# Patient Record
Sex: Male | Born: 1975 | Race: Black or African American | Hispanic: No | Marital: Single | Smoking: Never smoker
Health system: Southern US, Community
[De-identification: ages and names within clinical notes are randomized; demographics above are authoritative.]

## PROBLEM LIST (undated history)

## (undated) DIAGNOSIS — K219 Gastro-esophageal reflux disease without esophagitis: Secondary | ICD-10-CM

## (undated) HISTORY — PX: WRIST SURGERY: SHX841

---

## 2016-07-31 ENCOUNTER — Emergency Department (HOSPITAL_BASED_OUTPATIENT_CLINIC_OR_DEPARTMENT_OTHER)
Admission: EM | Admit: 2016-07-31 | Discharge: 2016-07-31 | Disposition: A | Discharge status: Home / Self Care | Payer: PRIVATE HEALTH INSURANCE | Attending: Emergency Medicine | Admitting: Emergency Medicine

## 2016-07-31 ENCOUNTER — Encounter (HOSPITAL_BASED_OUTPATIENT_CLINIC_OR_DEPARTMENT_OTHER): Payer: Self-pay | Admitting: Emergency Medicine

## 2016-07-31 DIAGNOSIS — R0981 Nasal congestion: Secondary | ICD-10-CM | POA: Insufficient documentation

## 2016-07-31 DIAGNOSIS — R52 Pain, unspecified: Secondary | ICD-10-CM | POA: Insufficient documentation

## 2016-07-31 DIAGNOSIS — R05 Cough: Secondary | ICD-10-CM | POA: Diagnosis present

## 2016-07-31 DIAGNOSIS — B349 Viral infection, unspecified: Secondary | ICD-10-CM | POA: Insufficient documentation

## 2016-07-31 NOTE — ED Provider Notes (Signed)
[facility] Provider Note   CSN: [id number] Arrival date & time: 07/31/16  0531     History   Chief Complaint Chief Complaint  Patient presents with  . URI    HPI Angel Graves is a 41 y.o. male.  Patient is a 41 year old male with no significant past medical history presenting with a 2 day history of cough, congestion, body aches, scratchy throat. He also reports that he has vomited twice. He reports his mother was recently ill with similar symptoms.   The history is provided by the patient.  URI   This is a new problem. The current episode started 2 days ago. The problem has been rapidly worsening. There has been no fever. Associated symptoms include nausea, vomiting, congestion, headaches, sore throat and cough. Pertinent negatives include no chest pain, no abdominal pain and no diarrhea. He has tried nothing for the symptoms.    History reviewed. No pertinent past medical history.  There are no active problems to display for this patient.   Past Surgical History:  Procedure Laterality Date  . WRIST SURGERY         Home Medications    Prior to Admission medications   Not on File    Family History No family history on file.  Social History Social History  Substance Use Topics  . Smoking status: Never Smoker  . Smokeless tobacco: Never Used  . Alcohol use No     Allergies   Patient has no known allergies.   Review of Systems Review of Systems  HENT: Positive for congestion and sore throat.   Respiratory: Positive for cough.   Cardiovascular: Negative for chest pain.  Gastrointestinal: Positive for nausea and vomiting. Negative for abdominal pain and diarrhea.  Neurological: Positive for headaches.  All other systems reviewed and are negative.    Physical Exam Updated Vital Signs BP 139/90 (BP Location: Left Arm)   Pulse 89   Temp 98.6 F (37 C) (Oral)   Resp 18   Ht 6\' 4"  (1.93 m)   Wt 120.2 kg (264 lb 14.4 oz)   SpO2 100%    BMI 32.24 kg/m   Physical Exam  Constitutional: He is oriented to person, place, and time. He appears well-developed and well-nourished. No distress.  HENT:  Head: Normocephalic and atraumatic.  Mouth/Throat: Oropharynx is clear and moist. No oropharyngeal exudate.  TMs are clear bilaterally.  Neck: Normal range of motion. Neck supple.  Cardiovascular: Normal rate and regular rhythm.  Exam reveals no friction rub.   No murmur heard. Pulmonary/Chest: Effort normal and breath sounds normal. No respiratory distress. He has no wheezes. He has no rales.  Abdominal: Soft. Bowel sounds are normal. He exhibits no distension. There is no tenderness.  Musculoskeletal: Normal range of motion. He exhibits no edema.  Neurological: He is alert and oriented to person, place, and time. Coordination normal.  Skin: Skin is warm and dry. He is not diaphoretic.  Nursing note and vitals reviewed.    ED Treatments / Results  Labs (all labs ordered are listed, but only abnormal results are displayed) Labs Reviewed - No data to display  EKG  EKG Interpretation None       Radiology No results found.  Procedures Procedures (including critical care time)  Medications Ordered in ED Medications - No data to display   Initial Impression / Assessment and Plan / ED Course  I have reviewed the triage vital signs and the nursing notes.  Pertinent labs &  imaging results that were available during my care of the patient were reviewed by me and considered in my medical decision making (see chart for details).  Patient presents with multiple complaints that seem viral in nature. His physical examination is unremarkable. I will advise fluids, rest, over-the-counter medications and follow-up as needed.  Final Clinical Impressions(s) / ED Diagnoses   Final diagnoses:  None    New Prescriptions New Prescriptions   No medications on file     Geoffery Lyonselo, Fareeda Downard, MD 07/31/16 [id number]

## 2016-07-31 NOTE — Discharge Instructions (Signed)
Over-the-counter medications as needed for symptomatic relief.  Drink plenty of fluids and get plenty of rest.  Tylenol 1000 mg rotated with ibuprofen 600 mg every 4 hours as needed for pain or fever.  Return to the emergency department if your symptoms significantly worsen or change.

## 2016-07-31 NOTE — ED Triage Notes (Signed)
Pt c/o cough, congestion, body aches and sore throat x 2 days. Pt states he vomited x 2 episodes.

## 2018-02-11 ENCOUNTER — Emergency Department (HOSPITAL_BASED_OUTPATIENT_CLINIC_OR_DEPARTMENT_OTHER)
Admission: EM | Admit: 2018-02-11 | Discharge: 2018-02-11 | Disposition: A | Discharge status: Home / Self Care | Payer: PRIVATE HEALTH INSURANCE | Attending: Emergency Medicine | Admitting: Emergency Medicine

## 2018-02-11 ENCOUNTER — Other Ambulatory Visit: Payer: Self-pay

## 2018-02-11 ENCOUNTER — Encounter (HOSPITAL_BASED_OUTPATIENT_CLINIC_OR_DEPARTMENT_OTHER): Payer: Self-pay | Admitting: Emergency Medicine

## 2018-02-11 DIAGNOSIS — B9789 Other viral agents as the cause of diseases classified elsewhere: Secondary | ICD-10-CM

## 2018-02-11 DIAGNOSIS — J069 Acute upper respiratory infection, unspecified: Secondary | ICD-10-CM | POA: Insufficient documentation

## 2018-02-11 MED ORDER — BENZONATATE 100 MG PO CAPS
100.0000 mg | ORAL_CAPSULE | Freq: Once | ORAL | Status: AC
  Administered 2018-02-11: 100 mg via ORAL
  Filled 2018-02-11: qty 1

## 2018-02-11 MED ORDER — BENZONATATE 100 MG PO CAPS: 100.0000 mg | ORAL_CAPSULE | Freq: Three times a day (TID) | ORAL | 0 refills | Status: AC

## 2018-02-11 NOTE — ED Triage Notes (Signed)
Pt c/o persistent cough for the past few days not getting better with OTC medication.

## 2018-02-11 NOTE — ED Provider Notes (Signed)
[facility] EMERGENCY DEPARTMENT Provider Note  CSN: [id number] Arrival date & time: 02/11/18 0343  Chief Complaint(s) Cough  HPI Angel Graves is a 43 y.o. male   The history is provided by the patient.  Cough  Cough characteristics:  Harsh, hacking and productive Sputum characteristics:  Yellow Severity:  Moderate Onset quality:  Gradual Duration:  2 days Timing:  Intermittent Progression:  Waxing and waning Chronicity:  New Smoker: no   Context: sick contacts, upper respiratory infection and weather changes   Relieved by:  Cough suppressants and decongestant Worsened by:  Nothing Associated symptoms: chills, myalgias, rhinorrhea, sinus congestion and sore throat   Associated symptoms: no chest pain and no shortness of breath     Past Medical History History reviewed. No pertinent past medical history. There are no active problems to display for this patient.  Home Medication(s) Prior to Admission medications   Medication Sig Start Date End Date Taking? Authorizing Provider  benzonatate (TESSALON) 100 MG capsule Take 1 capsule (100 mg total) by mouth every 8 (eight) hours. 02/11/18   Nira Connardama,  Eduardo, MD                                                                                                                                    Past Surgical History Past Surgical History:  Procedure Laterality Date  . WRIST SURGERY     Family History No family history on file.  Social History Social History   Tobacco Use  . Smoking status: Never Smoker  . Smokeless tobacco: Never Used  Substance Use Topics  . Alcohol use: No  . Drug use: No   Allergies Patient has no known allergies.  Review of Systems Review of Systems  Constitutional: Positive for chills.  HENT: Positive for rhinorrhea and sore throat.   Respiratory: Positive for cough. Negative for shortness of breath.   Cardiovascular: Negative for chest pain.  Musculoskeletal: Positive for  myalgias.   All other systems are reviewed and are negative for acute change except as noted in the HPI  Physical Exam Vital Signs  I have reviewed the triage vital signs BP (!) 159/88   Pulse 76   Temp 98.7 F (37.1 C) (Oral)   Resp 16   Ht 6\' 4"  (1.93 m)   Wt 117.9 kg   SpO2 99%   BMI 31.65 kg/m   Physical Exam Vitals signs reviewed.  Constitutional:      General: He is not in acute distress.    Appearance: He is well-developed. He is not diaphoretic.  HENT:     Head: Normocephalic and atraumatic.     Right Ear: Tympanic membrane and ear canal normal.     Left Ear: Tympanic membrane and ear canal normal.     Nose: Mucosal edema and rhinorrhea present.     Mouth/Throat:     Mouth: Mucous membranes are moist.     Tongue: No  lesions.     Palate: No lesions.     Pharynx: Oropharynx is clear.     Comments: Post nasal drip Eyes:     General: No scleral icterus.       Right eye: No discharge.        Left eye: No discharge.     Conjunctiva/sclera: Conjunctivae normal.     Pupils: Pupils are equal, round, and reactive to light.  Neck:     Musculoskeletal: Normal range of motion and neck supple.  Cardiovascular:     Rate and Rhythm: Normal rate and regular rhythm.     Heart sounds: No murmur. No friction rub. No gallop.   Pulmonary:     Effort: Pulmonary effort is normal. No respiratory distress.     Breath sounds: Normal breath sounds. No stridor. No rales.  Abdominal:     General: There is no distension.     Palpations: Abdomen is soft.     Tenderness: There is no abdominal tenderness.  Musculoskeletal:        General: No tenderness.  Skin:    General: Skin is warm and dry.     Findings: No erythema or rash.  Neurological:     Mental Status: He is alert and oriented to person, place, and time.     ED Results and Treatments Labs (all labs ordered are listed, but only abnormal results are displayed) Labs Reviewed - No data to display                                                                                                                        EKG  EKG Interpretation  Date/Time:    Ventricular Rate:    PR Interval:    QRS Duration:   QT Interval:    QTC Calculation:   R Axis:     Text Interpretation:        Radiology No results found. Pertinent labs & imaging results that were available during my care of the patient were reviewed by me and considered in my medical decision making (see chart for details).  Medications Ordered in ED Medications  benzonatate (TESSALON) capsule 100 mg (has no administration in time range)                                                                                                                                    Procedures Procedures  (  including critical care time)  Medical Decision Making / ED Course I have reviewed the nursing notes for this encounter and the patient's prior records (if available in EHR or on provided paperwork).    43 y.o. male presents with viral URI symptoms for 2 days. adequate oral hydration. Rest of history as above.  Patient appears well. No signs of toxicity. No hypoxia, tachypnea or other signs of respiratory distress. No sign of clinical dehydration. Lung exam clear. Rest of exam as above.  No evidence suggestive of pharyngitis, AOM, PNA.  Chest x-ray not indicated at this time.  Discussed symptomatic treatment with the patient and they will follow closely with their PCP.   The patient appears reasonably screened and/or stabilized for discharge and I doubt any other medical condition or other [facility] requiring further screening, evaluation, or treatment in the ED at this time prior to discharge.  The patient is safe for discharge with strict return precautions.     Final Clinical Impression(s) / ED Diagnoses Final diagnoses:  Viral URI with cough    Disposition: Discharge  Condition: Good  I have discussed the results, Dx and Tx plan with the patient  who expressed understanding and agree(s) with the plan. Discharge instructions discussed at great length. The patient was given strict return precautions who verbalized understanding of the instructions. No further questions at time of discharge.    ED Discharge Orders         Ordered    benzonatate (TESSALON) 100 MG capsule  Every 8 hours     02/11/18 0419           Follow Up: Primary care provider  Schedule an appointment as soon as possible for a visit  If you do not have a primary care physician, contact [facility] at [phone] for referral     This chart was dictated using voice recognition software.  Despite best efforts to proofread,  errors can occur which can change the documentation meaning.   Nira Conn, MD 02/11/18 [phone]

## 2018-02-11 NOTE — Discharge Instructions (Signed)
You may take over-the-counter medicine for symptomatic relief, such as Tylenol, Motrin, TheraFlu, Alka seltzer , black elderberry, etc. Please limit acetaminophen (Tylenol) to 4000 mg and Ibuprofen (Motrin, Advil, etc.) to 2400 mg for a 24hr period. Please note that other over-the-counter medicine may contain acetaminophen or ibuprofen as a component of their ingredients.   

## 2018-08-07 ENCOUNTER — Other Ambulatory Visit: Payer: Self-pay

## 2018-08-07 ENCOUNTER — Emergency Department (HOSPITAL_BASED_OUTPATIENT_CLINIC_OR_DEPARTMENT_OTHER)
Admission: EM | Admit: 2018-08-07 | Discharge: 2018-08-07 | Disposition: A | Discharge status: Home / Self Care | Payer: PRIVATE HEALTH INSURANCE | Attending: Emergency Medicine | Admitting: Emergency Medicine

## 2018-08-07 ENCOUNTER — Emergency Department (HOSPITAL_BASED_OUTPATIENT_CLINIC_OR_DEPARTMENT_OTHER): Payer: PRIVATE HEALTH INSURANCE

## 2018-08-07 ENCOUNTER — Encounter (HOSPITAL_BASED_OUTPATIENT_CLINIC_OR_DEPARTMENT_OTHER): Payer: Self-pay | Admitting: Emergency Medicine

## 2018-08-07 DIAGNOSIS — M545 Low back pain, unspecified: Secondary | ICD-10-CM

## 2018-08-07 MED ORDER — LIDOCAINE 5 % EX PTCH: 1.0000 | MEDICATED_PATCH | CUTANEOUS | 0 refills | Status: AC

## 2018-08-07 MED ORDER — METHOCARBAMOL 500 MG PO TABS: 500.0000 mg | ORAL_TABLET | Freq: Two times a day (BID) | ORAL | 0 refills | Status: DC

## 2018-08-07 NOTE — ED Notes (Signed)
Pt. returned from XR. 

## 2018-08-07 NOTE — ED Triage Notes (Signed)
Pt c/o lower back pain since Wed after picking up pallets at work

## 2018-08-07 NOTE — Discharge Instructions (Addendum)
Take medicines as prescribed.  Follow-up with PCP for reevaluation or as needed.  Return to ED for any new worsening symptoms.

## 2018-08-07 NOTE — ED Provider Notes (Signed)
[facility] EMERGENCY DEPARTMENT Provider Note   CSN: [id number] Arrival date & time: 08/07/18  1639  History   Chief Complaint Chief Complaint  Patient presents with  . Back Pain   HPI Angel Graves is a 43 y.o. male with past medical history who presents for evaluation of back pain.  Patient states he was at work picking up pallets when he developed right-sided back pain.  Pain starts in his midline back and radiates out to the right.  Denies radiation down his legs.  Denies history of IV drug use, bowel or bladder incontinence, saddle paresthesia, increased nighttime pain, history of malignancy or chronic steroid use.  Patient states this feels like similar back pain.  Patient states he was in the [employer] and had this pain frequently.  States he does get frequent muscle spasms.  Has been taking Tylenol at home with mild relief of symptoms.  Patient is concerned that he cannot go back to work on Monday due to the pain.  He has been resting and placing ice.  Denies fever, chills, nausea, vomiting, chest pain, shortness breath, abdominal pain, dysuria, hematuria, diarrhea or constipation.  Nuys additional aggravating or alleviating factors.  Pain is worse when he goes to stand or walk however has been ambulatory at home.  History obtained from patient and past medical records.  No interpreter was used.     HPI  History reviewed. No pertinent past medical history.  There are no active problems to display for this patient.   Past Surgical History:  Procedure Laterality Date  . WRIST SURGERY          Home Medications    Prior to Admission medications   Medication Sig Start Date End Date Taking? Authorizing Provider  benzonatate (TESSALON) 100 MG capsule Take 1 capsule (100 mg total) by mouth every 8 (eight) hours. 02/11/18   CardamaAmadeo Garnet, Pedro Eduardo, MD  lidocaine (LIDODERM) 5 % Place 1 patch onto the skin daily. Remove & Discard patch within 12 hours or as directed by MD 08/07/18    Santiago Graf A, PA-C  methocarbamol (ROBAXIN) 500 MG tablet Take 1 tablet (500 mg total) by mouth 2 (two) times daily. 08/07/18   Raydan Schlabach A, PA-C    Family History No family history on file.  Social History Social History   Tobacco Use  . Smoking status: Never Smoker  . Smokeless tobacco: Never Used  Substance Use Topics  . Alcohol use: No  . Drug use: No     Allergies   Patient has no known allergies.   Review of Systems Review of Systems  Constitutional: Negative.   HENT: Negative.   Respiratory: Negative.   Cardiovascular: Negative.   Gastrointestinal: Negative.   Genitourinary: Negative.   Musculoskeletal: Positive for back pain. Negative for arthralgias, gait problem, joint swelling and myalgias.  Skin: Negative.   Neurological: Negative.   All other systems reviewed and are negative.    Physical Exam Updated Vital Signs BP (!) 137/95   Pulse 78   Temp 98.4 F (36.9 C) (Oral)   Resp 20   Ht 6\' 3"  (1.905 m)   Wt 115.7 kg   SpO2 99%   BMI 31.87 kg/m   Physical Exam  Physical Exam  Constitutional: Pt appears well-developed and well-nourished. No distress.  HENT:  Head: Normocephalic and atraumatic.  Mouth/Throat: Oropharynx is clear and moist. No oropharyngeal exudate.  Eyes: Conjunctivae are normal.  Neck: Normal range of motion. Neck supple.  Full ROM  without pain  Cardiovascular: Normal rate, regular rhythm and intact distal pulses.   Pulmonary/Chest: Effort normal and breath sounds normal. No respiratory distress. Pt has no wheezes.  Abdominal: Soft. Pt exhibits no distension. There is no tenderness, rebound or guarding. No abd bruit or pulsatile mass Musculoskeletal:  Full range of motion of the T-spine and L-spine with flexion, hyperextension, and lateral flexion. No midline tenderness or stepoffs. No tenderness to palpation of the spinous processes of the T-spine or L-spine. Mild tenderness to palpation of the paraspinous  muscles of the RIGHT L-spine. Negative straight leg raise.  Palpable spasm to right paraspinal muscles. Lymphadenopathy:    Pt has no cervical adenopathy.  Neurological: Pt is alert. Pt has normal reflexes.  Reflex Scores:      Bicep reflexes are 2+ on the right side and 2+ on the left side.      Brachioradialis reflexes are 2+ on the right side and 2+ on the left side.      Patellar reflexes are 2+ on the right side and 2+ on the left side.      Achilles reflexes are 2+ on the right side and 2+ on the left side. Speech is clear and goal oriented, follows commands Normal 5/5 strength in upper and lower extremities bilaterally including dorsiflexion and plantar flexion, strong and equal grip strength Sensation normal to light and sharp touch Moves extremities without ataxia, coordination intact Normal gait Normal balance No Clonus Skin: Skin is warm and dry. No rash noted or lesions noted. Pt is not diaphoretic. No erythema, ecchymosis,edema or warmth.  Psychiatric: Pt has a normal mood and affect. Behavior is normal.  Nursing note and vitals reviewed. ED Treatments / Results  Labs (all labs ordered are listed, but only abnormal results are displayed) Labs Reviewed - No data to display  EKG None  Radiology Dg Lumbar Spine Complete  Result Date: 08/07/2018 CLINICAL DATA:  Acute onset low back pain since this am, thinks it happened while moving pallets. Hx old football injury, pinched nerve. Pain extends down anterior aspect of left leg. EXAM: LUMBAR SPINE - COMPLETE 4+ VIEW COMPARISON:  None. FINDINGS: There is mild disc height loss at L4-5. No acute fracture or subluxation. No spondylolysis or spondylolisthesis. IMPRESSION: No evidence for acute abnormality. Electronically Signed   By: Norva PavlovElizabeth  Brown M.D.   On: 08/07/2018 18:30    Procedures Procedures (including critical care time)  Medications Ordered in ED Medications - No data to display   Initial Impression / Assessment  and Plan / ED Course  I have reviewed the triage vital signs and the nursing notes.  Pertinent labs & imaging results that were available during my care of the patient were reviewed by me and considered in my medical decision making (see chart for details).  43 year old male appears otherwise well presents for evaluation of back pain.  He is afebrile, nonseptic, non-ill-appearing.  Patient with right-sided back pain after lifting pallets at work on Wednesday.  He has tenderness palpation with palpable spasm to his right paraspinal muscles.  He has a nonfocal neurologic exam without neurologic deficits.  Has a normal musculoskeletal exam.  He has no red flags for back pain.  No abdominal pain, urinary symptoms.  No overlying skin changes to back.  No evidence of zoster.  No fluctuance or induration.  Does not want anything for pain at this time.  No flank pain or urinary sx to suggest stone.  Patient with back pain.  No neurological deficits  and normal neuro exam.  Patient can walk but states is painful.  No loss of bowel or bladder control.  No concern for cauda equina.  No fever, night sweats, weight loss, h/o cancer, IVDU.  RICE protocol and pain medicine indicated and discussed with patient.   The patient has been appropriately medically screened and/or stabilized in the ED. I have low suspicion for any other emergent medical condition which would require further screening, evaluation or treatment in the ED or require inpatient management.  Patient is hemodynamically stable and in no acute distress.  Patient able to ambulate in department prior to ED.  Evaluation does not show acute pathology that would require ongoing or additional emergent interventions while in the emergency department or further inpatient treatment.  I have discussed the diagnosis with the patient and answered all questions.  Pain is been managed while in the emergency department and patient has no further complaints prior to  discharge.  Patient is comfortable with plan discussed in room and is stable for discharge at this time.  I have discussed strict return precautions for returning to the emergency department.  Patient was encouraged to follow-up with PCP/specialist refer to at discharge.     Final Clinical Impressions(s) / ED Diagnoses   Final diagnoses:  Acute right-sided low back pain without sciatica    ED Discharge Orders         Ordered    methocarbamol (ROBAXIN) 500 MG tablet  2 times daily     08/07/18 1854    lidocaine (LIDODERM) 5 %  Every 24 hours     08/07/18 1854           Krishika Bugge A, PA-C 08/07/18 1856    Drenda Freeze, MD 08/07/18 1902

## 2018-11-14 ENCOUNTER — Other Ambulatory Visit: Payer: Self-pay

## 2018-11-14 ENCOUNTER — Emergency Department (HOSPITAL_BASED_OUTPATIENT_CLINIC_OR_DEPARTMENT_OTHER)
Admission: EM | Admit: 2018-11-14 | Discharge: 2018-11-14 | Disposition: A | Discharge status: Home / Self Care | Payer: PRIVATE HEALTH INSURANCE | Attending: Emergency Medicine | Admitting: Emergency Medicine

## 2018-11-14 ENCOUNTER — Encounter (HOSPITAL_BASED_OUTPATIENT_CLINIC_OR_DEPARTMENT_OTHER): Payer: Self-pay

## 2018-11-14 ENCOUNTER — Emergency Department (HOSPITAL_BASED_OUTPATIENT_CLINIC_OR_DEPARTMENT_OTHER): Payer: PRIVATE HEALTH INSURANCE

## 2018-11-14 DIAGNOSIS — R0789 Other chest pain: Secondary | ICD-10-CM | POA: Insufficient documentation

## 2018-11-14 DIAGNOSIS — R1013 Epigastric pain: Secondary | ICD-10-CM | POA: Insufficient documentation

## 2018-11-14 DIAGNOSIS — R112 Nausea with vomiting, unspecified: Secondary | ICD-10-CM | POA: Insufficient documentation

## 2018-11-14 LAB — COMPREHENSIVE METABOLIC PANEL
ALT: 79 U/L — ABNORMAL HIGH (ref 0–44)
AST: 37 U/L (ref 15–41)
Albumin: 4.4 g/dL (ref 3.5–5.0)
Alkaline Phosphatase: 94 U/L (ref 38–126)
Anion gap: 11 (ref 5–15)
BUN: 9 mg/dL (ref 6–20)
CO2: 24 mmol/L (ref 22–32)
Calcium: 9.4 mg/dL (ref 8.9–10.3)
Chloride: 102 mmol/L (ref 98–111)
Creatinine, Ser: 0.87 mg/dL (ref 0.61–1.24)
GFR calc Af Amer: >60 mL/min (ref >60)
GFR calc non Af Amer: >60 mL/min (ref >60)
Glucose, Bld: 116 mg/dL — ABNORMAL HIGH (ref 70–99)
Potassium: 3.6 mmol/L (ref 3.5–5.1)
Sodium: 137 mmol/L (ref 135–145)
Total Bilirubin: 0.5 mg/dL (ref 0.3–1.2)
Total Protein: 7.5 g/dL (ref 6.5–8.1)

## 2018-11-14 LAB — CBC WITH DIFFERENTIAL/PLATELET
Abs Immature Granulocytes: 0.03 10*3/uL (ref 0.00–0.07)
Basophils Absolute: 0 10*3/uL (ref 0.0–0.1)
Basophils Relative: 1 %
Eosinophils Absolute: 0 10*3/uL (ref 0.0–0.5)
Eosinophils Relative: 0 %
HCT: 50 % (ref 39.0–52.0)
Hemoglobin: 16.1 g/dL (ref 13.0–17.0)
Immature Granulocytes: 0 %
Lymphocytes Relative: 17 %
Lymphs Abs: 1.2 10*3/uL (ref 0.7–4.0)
MCH: 26.3 pg (ref 26.0–34.0)
MCHC: 32.2 g/dL (ref 30.0–36.0)
MCV: 81.7 fL (ref 80.0–100.0)
Monocytes Absolute: 0.3 10*3/uL (ref 0.1–1.0)
Monocytes Relative: 4 %
Neutro Abs: 5.8 10*3/uL (ref 1.7–7.7)
Neutrophils Relative %: 78 %
Platelets: 238 10*3/uL (ref 150–400)
RBC: 6.12 MIL/uL — ABNORMAL HIGH (ref 4.22–5.81)
RDW: 15.7 % — ABNORMAL HIGH (ref 11.5–15.5)
WBC: 7.4 10*3/uL (ref 4.0–10.5)
nRBC: 0 % (ref 0.0–0.2)

## 2018-11-14 LAB — URINALYSIS, ROUTINE W REFLEX MICROSCOPIC
Bilirubin Urine: NEGATIVE
Glucose, UA: NEGATIVE mg/dL
Hgb urine dipstick: NEGATIVE
Ketones, ur: NEGATIVE mg/dL
Leukocytes,Ua: NEGATIVE
Nitrite: NEGATIVE
Protein, ur: NEGATIVE mg/dL
Specific Gravity, Urine: 1.01 (ref 1.005–1.030)
pH: >9 — ABNORMAL HIGH (ref 5.0–8.0)

## 2018-11-14 LAB — LIPASE, BLOOD: Lipase: 31 U/L (ref 11–51)

## 2018-11-14 MED ORDER — OMEPRAZOLE 20 MG PO CPDR: 20.0000 mg | DELAYED_RELEASE_CAPSULE | Freq: Every day | ORAL | 0 refills | Status: AC

## 2018-11-14 MED ORDER — SODIUM CHLORIDE 0.9 % IV BOLUS
1000.0000 mL | Freq: Once | INTRAVENOUS | Status: AC
  Administered 2018-11-14: 1000 mL via INTRAVENOUS

## 2018-11-14 MED ORDER — ONDANSETRON HCL 4 MG/2ML IJ SOLN
4.0000 mg | Freq: Once | INTRAMUSCULAR | Status: AC
  Administered 2018-11-14: 10:00:00 4 mg via INTRAVENOUS
  Filled 2018-11-14: qty 2

## 2018-11-14 MED ORDER — SUCRALFATE 1 GM/10ML PO SUSP: 1.0000 g | Freq: Three times a day (TID) | ORAL | 0 refills | Status: AC

## 2018-11-14 MED ORDER — HYDROMORPHONE HCL 1 MG/ML IJ SOLN
1.0000 mg | Freq: Once | INTRAMUSCULAR | Status: AC
  Administered 2018-11-14: 10:00:00 1 mg via INTRAVENOUS
  Filled 2018-11-14: qty 1

## 2018-11-14 MED ORDER — IOHEXOL 300 MG/ML  SOLN
100.0000 mL | Freq: Once | INTRAMUSCULAR | Status: AC | PRN
  Administered 2018-11-14: 11:00:00 100 mL via INTRAVENOUS

## 2018-11-14 NOTE — Discharge Instructions (Addendum)
Please read instructions below. Drink clear liquids until your stomach feels better. Then, slowly introduce bland foods into your diet as tolerated.  Avoid spicy, greasy, acidic foods as this can worsen your symptoms.  Avoid NSAID medications such as Advil/ibuprofen/Motrin, Aleve, aspirin, Goody's powder, BC powder.  Remain is sitting upright for at least 45 minutes after meals. You can take your zofran every 8 hours as needed for nausea. Take the Carafate before meals and at bedtime to help with stomach upset. Take the omeprazole daily. You can get this over-the-counter after your prescription runs out. Establish primary care. Return to the ER for severely worsening abdominal pain, fever, uncontrollable vomiting, or new or concerning symptoms.

## 2018-11-14 NOTE — ED Notes (Signed)
Pt states drove himself to ED  but will call for a ride to go home.

## 2018-11-14 NOTE — ED Provider Notes (Signed)
[facility] EMERGENCY DEPARTMENT Provider Note   CSN: [id number] Arrival date & time: 11/14/18  [id number]     History   Chief Complaint Chief Complaint  Patient presents with   Abdominal Pain    HPI Angel Graves is a 43 y.o. male with PMHx GERD, presenting to the ED with complaint of epigastric/central abd pain that has been intermittent over the last few days. Pain comes and goes, radiating down through his central abdomen. Pt is unable to describe the pain. Assoc with nausea and vomiting. Not made worse or better with meals, though has decreased appetite. Normal BM today. No assoc fever, cough, CP, urinary sx. No known gallbladder disease. Does take a daily antacid, unknown name but states it is 20mg . He treated his sx with zofran and pepto bismol without relief. Denies alcohol use.     The history is provided by the patient.    History reviewed. No pertinent past medical history.  There are no active problems to display for this patient.   Past Surgical History:  Procedure Laterality Date   WRIST SURGERY          Home Medications    Prior to Admission medications   Medication Sig Start Date End Date Taking? Authorizing Provider  benzonatate (TESSALON) 100 MG capsule Take 1 capsule (100 mg total) by mouth every 8 (eight) hours. 02/11/18   CardamaAmadeo Garnet, Pedro Eduardo, MD  lidocaine (LIDODERM) 5 % Place 1 patch onto the skin daily. Remove & Discard patch within 12 hours or as directed by MD 08/07/18   Henderly, Britni A, PA-C  methocarbamol (ROBAXIN) 500 MG tablet Take 1 tablet (500 mg total) by mouth 2 (two) times daily. 08/07/18   Henderly, Britni A, PA-C  omeprazole (PRILOSEC) 20 MG capsule Take 1 capsule (20 mg total) by mouth daily. 11/14/18   Alon Mazor, [place] N, PA-C  sucralfate (CARAFATE) 1 GM/10ML suspension Take 10 mLs (1 g total) by mouth 4 (four) times daily -  with meals and at bedtime. 11/14/18   Eun Vermeer, [place] N, PA-C    Family History History reviewed. No  pertinent family history.  Social History Social History   Tobacco Use   Smoking status: Never Smoker   Smokeless tobacco: Never Used  Substance Use Topics   Alcohol use: No   Drug use: No     Allergies   Patient has no known allergies.   Review of Systems Review of Systems  Gastrointestinal: Positive for abdominal pain, nausea and vomiting.  All other systems reviewed and are negative.    Physical Exam Updated Vital Signs BP (!) 146/85    Pulse 70    Temp 97.6 F (36.4 C) (Oral)    Resp 14    Ht 6\' 4"  (1.93 m)    Wt 113.4 kg    SpO2 98%    BMI 30.43 kg/m   Physical Exam Vitals signs and nursing note reviewed.  Constitutional:      Appearance: He is well-developed.     Comments: Pt appears anxious, will not sit still in the bed.  HENT:     Head: Normocephalic and atraumatic.  Eyes:     Conjunctiva/sclera: Conjunctivae normal.  Cardiovascular:     Rate and Rhythm: Normal rate and regular rhythm.  Pulmonary:     Effort: Pulmonary effort is normal. No respiratory distress.     Breath sounds: Normal breath sounds.  Abdominal:     General: Bowel sounds are normal.     Palpations: Abdomen  is soft.     Tenderness: There is abdominal tenderness in the epigastric area and periumbilical area. There is no guarding or rebound. Negative signs include Murphy's sign.  Skin:    General: Skin is warm.  Neurological:     Mental Status: He is alert.  Psychiatric:        Behavior: Behavior normal.      ED Treatments / Results  Labs (all labs ordered are listed, but only abnormal results are displayed) Labs Reviewed  COMPREHENSIVE METABOLIC PANEL - Abnormal; Notable for the following components:      Result Value   Glucose, Bld 116 (*)    ALT 79 (*)    All other components within normal limits  CBC WITH DIFFERENTIAL/PLATELET - Abnormal; Notable for the following components:   RBC 6.12 (*)    RDW 15.7 (*)    All other components within normal limits  URINALYSIS,  ROUTINE W REFLEX MICROSCOPIC - Abnormal; Notable for the following components:   pH >9.0 (*)    All other components within normal limits  LIPASE, BLOOD    EKG EKG Interpretation  Date/Time:  Sunday November 14 2018 10:08:11 EST Ventricular Rate:  60 PR Interval:    QRS Duration: 94 QT Interval:  412 QTC Calculation: 412 R Axis:   81 Text Interpretation: Sinus rhythm Prolonged PR interval no ischemic appearance. no old comparison. Confirmed by Charlesetta Shanks [phone]) on 11/14/2018 10:12:06 AM   Radiology Ct Abdomen Pelvis W Contrast  Result Date: 11/14/2018 CLINICAL DATA:  43 year old with acute abdominal pain. EXAM: CT ABDOMEN AND PELVIS WITH CONTRAST TECHNIQUE: Multidetector CT imaging of the abdomen and pelvis was performed using the standard protocol following bolus administration of intravenous contrast. CONTRAST:  183mL OMNIPAQUE IOHEXOL 300 MG/ML  SOLN COMPARISON:  None. FINDINGS: Lower chest: Lung bases are clear. Hepatobiliary: 1.1 cm low-density structure along the anterior left hepatic lobe probably represents a cyst. No other focal liver lesions. Normal appearance of the gallbladder. Portal venous system is patent. No biliary dilatation. Pancreas: Unremarkable. No pancreatic ductal dilatation or surrounding inflammatory changes. Spleen: Normal in size without focal abnormality. Adrenals/Urinary Tract: Normal adrenal glands. Probable small cyst along the right kidney lower pole but too small to definitively characterize. No suspicious renal lesions. No hydronephrosis. Normal appearance of the urinary bladder. Stomach/Bowel: Stomach is within normal limits. Appendix appears normal. No evidence of bowel wall thickening, distention, or inflammatory changes. Vascular/Lymphatic: Minimal atherosclerotic disease in the right common iliac artery. Normal caliber of the abdominal aorta. Main visceral arteries are patent. Venous structures are unremarkable. No abdominopelvic lymphadenopathy.  Reproductive: Prostate is unremarkable. Other: Negative for ascites.  Negative for free air. Musculoskeletal: Mild disc space narrowing at L4-L5. No acute bone abnormality. IMPRESSION: No acute abnormality in the abdomen or pelvis. Probable hepatic and renal cysts as described. Electronically Signed   By: Markus Daft M.D.   On: 11/14/2018 11:27    Procedures Procedures (including critical care time)  Medications Ordered in ED Medications  HYDROmorphone (DILAUDID) injection 1 mg (1 mg Intravenous Given 11/14/18 0950)  sodium chloride 0.9 % bolus 1,000 mL (0 mLs Intravenous Stopped 11/14/18 1114)  ondansetron (ZOFRAN) injection 4 mg (4 mg Intravenous Given 11/14/18 0948)  iohexol (OMNIPAQUE) 300 MG/ML solution 100 mL (100 mLs Intravenous Contrast Given 11/14/18 1038)     Initial Impression / Assessment and Plan / ED Course  I have reviewed the triage vital signs and the nursing notes.  Pertinent labs & imaging results that were available  during my care of the patient were reviewed by me and considered in my medical decision making (see chart for details).        Patient presenting with epigastric abdominal pain intermittently over the last couple of days.  Known history of GERD, treated with Pepcid.  Initial exam, patient appears anxious and uncomfortable, unable to sit still in bed.  Abdomen with generalized tenderness though worse in the epigastrium, negative Murphy sign.  We will proceed with labs and imaging given patient's distress.  IV fluids, antiemetics and pain medication administered.  Labs and imaging are very reassuring July derangement.  Lipase is normal.  UA negative.  CT scan is negative.  Suspect symptoms are likely due to gastritis versus gastric ulcer.  Patient with improvement in symptoms on reevaluation.  He will be discharged with symptomatic management, including PPI, Carafate.  Discussed diet modifications and outpatient follow-up.  He is agreeable to plan and safe for  discharge.  Discussed results, findings, treatment and follow up. Patient advised of return precautions. Patient verbalized understanding and agreed with plan.  Final Clinical Impressions(s) / ED Diagnoses   Final diagnoses:  Epigastric abdominal pain    ED Discharge Orders         Ordered    omeprazole (PRILOSEC) 20 MG capsule  Daily     11/14/18 1225    sucralfate (CARAFATE) 1 GM/10ML suspension  3 times daily with meals & bedtime     11/14/18 1225           Adaleena Mooers, [place] N, [place] 11/14/18 1227    Arby Barrette, MD 11/16/18 1621

## 2018-11-14 NOTE — ED Notes (Signed)
Pt discharged. Ambulating with steady gait.  Friend on the way to pick up. Pt states will wait outside.

## 2018-11-14 NOTE — ED Notes (Signed)
Pt states feeling much better.  Tolerating po fluids.  Calling for ride home for discharge after receiving narcotic pain medication.

## 2018-11-14 NOTE — ED Notes (Signed)
Called patient from lobby appeared to be NAD, watching TV.

## 2018-11-14 NOTE — ED Triage Notes (Signed)
Pt describes severe abdominal pain since Wednesday, after eating, took pepto, zofran. Did have some relief, tolerating soup, today pain returned with no relief with same medications. Pt points to upper abdomen

## 2018-11-14 NOTE — ED Notes (Signed)
Pt aware of need for urine specimen, unable to provide at this time, IV fluids infusing as ordered 

## 2019-01-11 ENCOUNTER — Emergency Department (HOSPITAL_BASED_OUTPATIENT_CLINIC_OR_DEPARTMENT_OTHER)
Admission: EM | Admit: 2019-01-11 | Discharge: 2019-01-11 | Disposition: A | Discharge status: Home / Self Care | Payer: Self-pay | Attending: Emergency Medicine | Admitting: Emergency Medicine

## 2019-01-11 ENCOUNTER — Emergency Department (HOSPITAL_BASED_OUTPATIENT_CLINIC_OR_DEPARTMENT_OTHER): Payer: Self-pay

## 2019-01-11 ENCOUNTER — Other Ambulatory Visit: Payer: Self-pay

## 2019-01-11 ENCOUNTER — Encounter (HOSPITAL_BASED_OUTPATIENT_CLINIC_OR_DEPARTMENT_OTHER): Payer: Self-pay | Admitting: Emergency Medicine

## 2019-01-11 DIAGNOSIS — L03313 Cellulitis of chest wall: Secondary | ICD-10-CM | POA: Insufficient documentation

## 2019-01-11 DIAGNOSIS — R21 Rash and other nonspecific skin eruption: Secondary | ICD-10-CM | POA: Insufficient documentation

## 2019-01-11 LAB — COMPREHENSIVE METABOLIC PANEL
ALT: 19 U/L (ref 0–44)
AST: 23 U/L (ref 15–41)
Albumin: 4.2 g/dL (ref 3.5–5.0)
Alkaline Phosphatase: 94 U/L (ref 38–126)
Anion gap: 10 (ref 5–15)
BUN: 7 mg/dL (ref 6–20)
CO2: 25 mmol/L (ref 22–32)
Calcium: 9 mg/dL (ref 8.9–10.3)
Chloride: 97 mmol/L — ABNORMAL LOW (ref 98–111)
Creatinine, Ser: 0.84 mg/dL (ref 0.61–1.24)
GFR calc Af Amer: >60 mL/min (ref >60)
GFR calc non Af Amer: >60 mL/min (ref >60)
Glucose, Bld: 106 mg/dL — ABNORMAL HIGH (ref 70–99)
Potassium: 3.7 mmol/L (ref 3.5–5.1)
Sodium: 132 mmol/L — ABNORMAL LOW (ref 135–145)
Total Bilirubin: 0.7 mg/dL (ref 0.3–1.2)
Total Protein: 7.9 g/dL (ref 6.5–8.1)

## 2019-01-11 LAB — CBC WITH DIFFERENTIAL/PLATELET
Abs Immature Granulocytes: 0.04 10*3/uL (ref 0.00–0.07)
Basophils Absolute: 0 10*3/uL (ref 0.0–0.1)
Basophils Relative: 0 %
Eosinophils Absolute: 0 10*3/uL (ref 0.0–0.5)
Eosinophils Relative: 0 %
HCT: 48.3 % (ref 39.0–52.0)
Hemoglobin: 15.5 g/dL (ref 13.0–17.0)
Immature Granulocytes: 0 %
Lymphocytes Relative: 10 %
Lymphs Abs: 1.4 10*3/uL (ref 0.7–4.0)
MCH: 25.9 pg — ABNORMAL LOW (ref 26.0–34.0)
MCHC: 32.1 g/dL (ref 30.0–36.0)
MCV: 80.8 fL (ref 80.0–100.0)
Monocytes Absolute: 0.9 10*3/uL (ref 0.1–1.0)
Monocytes Relative: 6 %
Neutro Abs: 11.6 10*3/uL — ABNORMAL HIGH (ref 1.7–7.7)
Neutrophils Relative %: 84 %
Platelets: 197 10*3/uL (ref 150–400)
RBC: 5.98 MIL/uL — ABNORMAL HIGH (ref 4.22–5.81)
RDW: 15.1 % (ref 11.5–15.5)
WBC: 14 10*3/uL — ABNORMAL HIGH (ref 4.0–10.5)
nRBC: 0 % (ref 0.0–0.2)

## 2019-01-11 LAB — LACTIC ACID, PLASMA: Lactic Acid, Venous: 1.1 mmol/L (ref 0.5–1.9)

## 2019-01-11 MED ORDER — CLINDAMYCIN HCL 150 MG PO CAPS: 450.0000 mg | ORAL_CAPSULE | Freq: Three times a day (TID) | ORAL | 0 refills | Status: AC

## 2019-01-11 MED ORDER — IOHEXOL 300 MG/ML  SOLN
100.0000 mL | Freq: Once | INTRAMUSCULAR | Status: AC | PRN
  Administered 2019-01-11: 100 mL via INTRA_ARTERIAL

## 2019-01-11 MED ORDER — CLINDAMYCIN PHOSPHATE 600 MG/50ML IV SOLN
600.0000 mg | Freq: Three times a day (TID) | INTRAVENOUS | Status: DC
  Administered 2019-01-11: 600 mg via INTRAVENOUS
  Filled 2019-01-11: qty 50

## 2019-01-11 MED ORDER — ACETAMINOPHEN 500 MG PO TABS
1000.0000 mg | ORAL_TABLET | Freq: Once | ORAL | Status: AC
  Administered 2019-01-11: 1000 mg via ORAL
  Filled 2019-01-11: qty 2

## 2019-01-11 MED ORDER — LIDOCAINE HCL (PF) 1 % IJ SOLN
30.0000 mL | Freq: Once | INTRAMUSCULAR | Status: AC
  Administered 2019-01-11: 5 mL
  Filled 2019-01-11: qty 30

## 2019-01-11 MED ORDER — SODIUM CHLORIDE 0.9 % IV BOLUS
1000.0000 mL | Freq: Once | INTRAVENOUS | Status: AC
  Administered 2019-01-11: 1000 mL via INTRAVENOUS

## 2019-01-11 NOTE — Discharge Instructions (Signed)
Please return to ER and 24 to 48 hours for recheck of your area of cellulitis.  If the area of redness and swelling increases outside the margins of the marker, you have recurrent fever, any difficulty in breathing, please return at that time for reassessment.  Recommend 3 times daily warm compresses to affected area.  Please keep this area clean and dry, avoid sweat and irritation.  Recommend against heavy lifting.

## 2019-01-11 NOTE — ED Notes (Signed)
ED Provider at bedside. 

## 2019-01-11 NOTE — ED Triage Notes (Signed)
Large abscess under L axilla. C/o chills and vomited once today.

## 2019-01-11 NOTE — ED Notes (Signed)
Awoke this am approx 0200hrs with fever, had chills. Went to work at [employer]. States he noted a small area at left axilla area.   Left axilla area noted to be reddish and swollen area noted. Pt states has some tenderness with palpation.   Area marked with skin marker, measures approx 10 x 8

## 2019-01-11 NOTE — ED Notes (Signed)
Denies any LUE CMS changes

## 2019-01-11 NOTE — ED Provider Notes (Signed)
[facility] EMERGENCY DEPARTMENT Provider Note   CSN: [id number] Arrival date & time: 01/11/19  1637     History Chief Complaint  Patient presents with  . Abscess    Angel Graves is a 43 y.o. male.  Presents to the emergency department with chief complaint of swelling and redness over his left chest/left axilla.  States he noted a small area yesterday, seem to be worsening today.  Denies any recent injuries.  Has had small abscess before in his axilla but never like this.  No difficulty breathing, has had subjective fever and chills.  No known medical problems, no allergies to medications.  HPI     History reviewed. No pertinent past medical history.  There are no problems to display for this patient.   Past Surgical History:  Procedure Laterality Date  . WRIST SURGERY         No family history on file.  Social History   Tobacco Use  . Smoking status: Never Smoker  . Smokeless tobacco: Never Used  Substance Use Topics  . Alcohol use: No  . Drug use: No    Home Medications Prior to Admission medications   Medication Sig Start Date End Date Taking? Authorizing Provider  benzonatate (TESSALON) 100 MG capsule Take 1 capsule (100 mg total) by mouth every 8 (eight) hours. 02/11/18   Cardama, Amadeo GarnetPedro Eduardo, MD  clindamycin (CLEOCIN) 150 MG capsule Take 3 capsules (450 mg total) by mouth 3 (three) times daily for 7 days. 01/11/19 01/18/19  Milagros Lollykstra, Merlen Gurry S, MD  lidocaine (LIDODERM) 5 % Place 1 patch onto the skin daily. Remove & Discard patch within 12 hours or as directed by MD 08/07/18   Henderly, Britni A, PA-C  methocarbamol (ROBAXIN) 500 MG tablet Take 1 tablet (500 mg total) by mouth 2 (two) times daily. 08/07/18   Henderly, Britni A, PA-C  omeprazole (PRILOSEC) 20 MG capsule Take 1 capsule (20 mg total) by mouth daily. 11/14/18   Robinson, [place] N, PA-C  sucralfate (CARAFATE) 1 GM/10ML suspension Take 10 mLs (1 g total) by mouth 4 (four) times daily -  with  meals and at bedtime. 11/14/18   Robinson, [place] N, PA-C    Allergies    Patient has no known allergies.  Review of Systems   Review of Systems  Constitutional: Negative for chills and fever.  HENT: Negative for ear pain and sore throat.   Eyes: Negative for pain and visual disturbance.  Respiratory: Negative for cough and shortness of breath.   Cardiovascular: Negative for chest pain and palpitations.  Gastrointestinal: Negative for abdominal pain and vomiting.  Genitourinary: Negative for dysuria and hematuria.  Musculoskeletal: Negative for arthralgias and back pain.  Skin: Positive for color change and rash.  Neurological: Negative for seizures and syncope.  All other systems reviewed and are negative.   Physical Exam Updated Vital Signs BP (!) 152/100   Pulse 91   Temp 99.6 F (37.6 C) (Oral)   Resp 18   Ht 6\' 4"  (1.93 m)   Wt 113.4 kg   SpO2 99%   BMI 30.43 kg/m   Physical Exam Vitals and nursing note reviewed.  Constitutional:      Appearance: He is well-developed.  HENT:     Head: Normocephalic and atraumatic.  Eyes:     Conjunctiva/sclera: Conjunctivae normal.  Cardiovascular:     Rate and Rhythm: Normal rate and regular rhythm.     Heart sounds: No murmur.  Pulmonary:  Effort: Pulmonary effort is normal. No respiratory distress.     Breath sounds: Normal breath sounds.  Abdominal:     Palpations: Abdomen is soft.     Tenderness: There is no abdominal tenderness.  Musculoskeletal:     Cervical back: Neck supple.     Comments: ~6cm diameter area of erythema with some underlying induration on lateral left chest wall inferior to axilla. No fluctuance.   Skin:    General: Skin is warm and dry.     Capillary Refill: Capillary refill takes less than 2 seconds.     Findings: Rash present.  Neurological:     Mental Status: He is alert.     ED Results / Procedures / Treatments   Labs (all labs ordered are listed, but only abnormal results are  displayed) Labs Reviewed  COMPREHENSIVE METABOLIC PANEL - Abnormal; Notable for the following components:      Result Value   Sodium 132 (*)    Chloride 97 (*)    Glucose, Bld 106 (*)    All other components within normal limits  CBC WITH DIFFERENTIAL/PLATELET - Abnormal; Notable for the following components:   WBC 14.0 (*)    RBC 5.98 (*)    MCH 25.9 (*)    Neutro Abs 11.6 (*)    All other components within normal limits  CULTURE, BLOOD (ROUTINE X 2)  CULTURE, BLOOD (ROUTINE X 2)  LACTIC ACID, PLASMA  LACTIC ACID, PLASMA  URINALYSIS, ROUTINE W REFLEX MICROSCOPIC    EKG None  Radiology No results found.  Procedures Procedures (including critical care time)  Medications Ordered in ED Medications  clindamycin (CLEOCIN) IVPB 600 mg (0 mg Intravenous Stopped 01/11/19 2000)  acetaminophen (TYLENOL) tablet 1,000 mg (1,000 mg Oral Given 01/11/19 1658)  sodium chloride 0.9 % bolus 1,000 mL (0 mLs Intravenous Stopped 01/11/19 2042)  lidocaine (PF) (XYLOCAINE) 1 % injection 30 mL (5 mLs Infiltration Given 01/11/19 1833)  iohexol (OMNIPAQUE) 300 MG/ML solution 100 mL (100 mLs Intra-arterial Contrast Given 01/11/19 1849)    ED Course  I have reviewed the triage vital signs and the nursing notes.  Pertinent labs & imaging results that were available during my care of the patient were reviewed by me and considered in my medical decision making (see chart for details).    MDM Rules/Calculators/A&P                      43 year old male presents to ER with concern for possible abscess, rash on left chest wall/axilla.  On exam noted.  Concerning for cellulitis versus early abscess.  Given his fever, tachycardia noted initially, placed orders to obtain blood cultures, started IV antibiotics and obtained CT scan to further evaluate.  CT demonstrated cellulitis without discrete abscess, no deeper penetration of the infection.  Blood work concerning for leukocytosis.  Patient remained  well-appearing throughout the visit.  I discussed the options of admission for observation and continuation of IV antibiotics versus discharge with close outpatient follow-up and strict return precautions.  Patient states he prefer to try course of outpatient therapy.  Believe this is reasonable at this time.  His vital signs normalized after controlling his fever and providing some fluids.  Will give Rx for clindamycin.  Patient does not have a primary care doctor, however he is willing to return to the ER for a recheck within 24 to 48 hours.  Area was marked, patient verbalized understanding for need for recheck and return precautions. Will dc  home.  After the discussed management above, the patient was determined to be safe for discharge.  The patient was in agreement with this plan and all questions regarding their care were answered.  ED return precautions were discussed and the patient will return to the ED with any significant worsening of condition.   Final Clinical Impression(s) / ED Diagnoses Final diagnoses:  Cellulitis of chest wall    Rx / DC Orders ED Discharge Orders         Ordered    clindamycin (CLEOCIN) 150 MG capsule  3 times daily     01/11/19 2025           Milagros Loll, MD 01/11/19 2330

## 2019-01-11 NOTE — ED Notes (Signed)
BC x 2 to lab, IVF bolus initiated with IVPB ABX initiated as well, care explained to pt, opportunity for questions provided

## 2019-01-13 ENCOUNTER — Emergency Department (HOSPITAL_BASED_OUTPATIENT_CLINIC_OR_DEPARTMENT_OTHER)
Admission: EM | Admit: 2019-01-13 | Discharge: 2019-01-13 | Disposition: A | Discharge status: Home / Self Care | Payer: Self-pay | Attending: Emergency Medicine | Admitting: Emergency Medicine

## 2019-01-13 ENCOUNTER — Other Ambulatory Visit: Payer: Self-pay

## 2019-01-13 ENCOUNTER — Encounter (HOSPITAL_BASED_OUTPATIENT_CLINIC_OR_DEPARTMENT_OTHER): Payer: Self-pay | Admitting: Emergency Medicine

## 2019-01-13 DIAGNOSIS — L03313 Cellulitis of chest wall: Secondary | ICD-10-CM | POA: Insufficient documentation

## 2019-01-13 DIAGNOSIS — Z79899 Other long term (current) drug therapy: Secondary | ICD-10-CM | POA: Insufficient documentation

## 2019-01-13 HISTORY — DX: Gastro-esophageal reflux disease without esophagitis: K21.9

## 2019-01-13 NOTE — ED Triage Notes (Signed)
Pt here for cellulitis to left axilla.  Redness and swelling has increased.  Here for recheck.

## 2019-01-13 NOTE — Discharge Instructions (Addendum)
You have been diagnosed today with Cellulitis.  At this time there does not appear to be the presence of an emergent medical condition, however there is always the potential for conditions to change. Please read and follow the below instructions.  Please return to the Emergency Department immediately for any new or worsening symptoms or if your symptoms do not improve within 2 days. Please be sure to follow up with your Primary Care Provider within one week regarding your visit today; please call their office to schedule an appointment even if you are feeling better for a follow-up visit. Please continue take your antibiotic clindamycin as previously prescribed for treatment of your infection. I advise that you have the area rechecked in 1-2 days to ensure improvement.  If the redness continues to spread you may need to have blood work and further imaging to evaluate the area.  You may return to the emergency department for recheck of your area.  If you have any new or worsening symptoms do not wait 1 or 2 days for recheck and return immediately to the emergency department for evaluation. Please drink plenty of water and get plenty of rest.  Get help right away if: You feel very sleepy. You throw up (vomit) or have watery poop (diarrhea) for a long time. You see red streaks coming from the area. Your red area gets larger. Your red area turns dark in color. You have numbness or tingling You have chest pain or trouble breathing You have fever You have any new/concerning or worsening of symptoms.  Please read the additional information packets attached to your discharge summary.  Do not take your medicine if  develop an itchy rash, swelling in your mouth or lips, or difficulty breathing; call 911 and seek immediate emergency medical attention if this occurs.  Note: Portions of this text may have been transcribed using voice recognition software. Every effort was made to ensure accuracy;  however, inadvertent computerized transcription errors may still be present.

## 2019-01-13 NOTE — ED Provider Notes (Signed)
[facility] EMERGENCY DEPARTMENT Provider Note   CSN: [id number] Arrival date & time: 01/13/19  1034     History Chief Complaint  Patient presents with  . Abscess    Angel Graves is a 43 y.o. male history of GERD, obesity.  Patient presents today for recheck of cellulitis of the left axilla.  Symptoms began 3-4 days ago, he describes a moderate aching sensation constant worsened with palpation and improved with rest, nonradiating. - Patient was seen in the ER on 01/11/2019 work-up/treatment included:  1 g Tylenol CMP nonacute Lactic within normal limits CBC leukocytosis of 14.0 with left shift Blood cultures which are pending CT chest with contrast: IMPRESSION: 1. Incomplete inclusion of the right thorax 2. Edema within the subcutaneous fat of the left upper posterolateral chest wall in the region demarcated by a BB, felt consistent with cellulitis. Negative for rim enhancing fluid collection to suggest abscess. 3. Mild gynecomastia  600 mg IV clindamycin  Discharge with clindamycin 450 mg 3 times daily x7 days.  He was encouraged to follow-up for recheck in 1-2 days. - Patient presents today for recheck of his cellulitis, he reports he was unable to obtain his antibiotic clindamycin until yesterday afternoon, he reports he has had 4 doses of clindamycin thus far.  He reports that his erythema has spread slightly beyond the inferior border.  He denies any recurrence of fever and has no other concerns today.  Reports he is otherwise feeling well today.  HPI     Past Medical History:  Diagnosis Date  . GERD (gastroesophageal reflux disease)     There are no problems to display for this patient.   Past Surgical History:  Procedure Laterality Date  . WRIST SURGERY         No family history on file.  Social History   Tobacco Use  . Smoking status: Never Smoker  . Smokeless tobacco: Never Used  Substance Use Topics  . Alcohol use: No  . Drug use:  No    Home Medications Prior to Admission medications   Medication Sig Start Date End Date Taking? Authorizing Provider  benzonatate (TESSALON) 100 MG capsule Take 1 capsule (100 mg total) by mouth every 8 (eight) hours. 02/11/18   Cardama, Grayce Sessions, MD  clindamycin (CLEOCIN) 150 MG capsule Take 3 capsules (450 mg total) by mouth 3 (three) times daily for 7 days. 01/11/19 01/18/19  Lucrezia Starch, MD  lidocaine (LIDODERM) 5 % Place 1 patch onto the skin daily. Remove & Discard patch within 12 hours or as directed by MD 08/07/18   Henderly, Britni A, PA-C  methocarbamol (ROBAXIN) 500 MG tablet Take 1 tablet (500 mg total) by mouth 2 (two) times daily. 08/07/18   Henderly, Britni A, PA-C  omeprazole (PRILOSEC) 20 MG capsule Take 1 capsule (20 mg total) by mouth daily. 11/14/18   Robinson, [place] N, PA-C  sucralfate (CARAFATE) 1 GM/10ML suspension Take 10 mLs (1 g total) by mouth 4 (four) times daily -  with meals and at bedtime. 11/14/18   Robinson, [place] N, PA-C    Allergies    Patient has no known allergies.  Review of Systems   Review of Systems Ten systems are reviewed and are negative for acute change except as noted in the HPI  Physical Exam Updated Vital Signs BP 139/83   Pulse 98   Temp 99.2 F (37.3 C) (Oral)   Resp 20   Ht 6\' 4"  (1.93 m)   Wt 113.4  kg   SpO2 99%   BMI 30.43 kg/m   Physical Exam Constitutional:      General: He is not in acute distress.    Appearance: Normal appearance. He is well-developed. He is not ill-appearing or diaphoretic.  HENT:     Head: Normocephalic and atraumatic.     Right Ear: External ear normal.     Left Ear: External ear normal.     Nose: Nose normal.  Eyes:     General: Vision grossly intact. Gaze aligned appropriately.     Pupils: Pupils are equal, round, and reactive to light.  Neck:     Trachea: Trachea and phonation normal. No tracheal deviation.  Pulmonary:     Effort: Pulmonary effort is normal. No respiratory  distress.  Abdominal:     General: There is no distension.     Palpations: Abdomen is soft.     Tenderness: There is no abdominal tenderness. There is no guarding or rebound.  Musculoskeletal:        General: Normal range of motion.     Cervical back: Normal range of motion.  Skin:    General: Skin is warm and dry.          Comments: Erythema of left axilla, orders previously marked, erythema is within the borders to be superior anterior and posterior lines, that extends 2-3 cm inferiorly.  No fluctuance or drainage.  Neurological:     Mental Status: He is alert.     GCS: GCS eye subscore is 4. GCS verbal subscore is 5. GCS motor subscore is 6.     Comments: Speech is clear and goal oriented, follows commands Major Cranial nerves without deficit, no facial droop Moves extremities without ataxia, coordination intact  Psychiatric:        Behavior: Behavior normal.     ED Results / Procedures / Treatments   Labs (all labs ordered are listed, but only abnormal results are displayed) Labs Reviewed - No data to display  EKG None  Radiology CT Chest W Contrast  Result Date: 01/11/2019 CLINICAL DATA:  Chest pain, chest wall abscess cellulitis swollen left axilla EXAM: CT CHEST WITH CONTRAST TECHNIQUE: Multidetector CT imaging of the chest was performed during intravenous contrast administration. CONTRAST:  100mL OMNIPAQUE IOHEXOL 300 MG/ML  SOLN COMPARISON:  None. FINDINGS: Note that the right chest was incompletely included in the field of view Cardiovascular: Nonaneurysmal aorta. Normal heart size. Negative for pericardial effusion Mediastinum/Nodes: No enlarged mediastinal, hilar, or axillary lymph nodes. Thyroid gland, trachea, and esophagus demonstrate no significant findings. Lungs/Pleura: Lungs are clear. No pleural effusion or pneumothorax. Upper Abdomen: No acute abnormality. Musculoskeletal: No acute osseous abnormality. Small amount of left gynecomastia. Metallic BB over the  left upper posterolateral chest wall in the region of concern. There is infiltration of the subcutaneous fat consistent with edema. No rim enhancing fluid collection. Negative for radiopaque foreign body or soft tissue emphysema. IMPRESSION: 1. Incomplete inclusion of the right thorax 2. Edema within the subcutaneous fat of the left upper posterolateral chest wall in the region demarcated by a BB, felt consistent with cellulitis. Negative for rim enhancing fluid collection to suggest abscess. 3. Mild gynecomastia Electronically Signed   By: Jasmine PangKim  Fujinaga M.D.   On: 01/11/2019 19:28    Procedures Ultrasound ED Soft Tissue  Date/Time: 01/13/2019 11:39 AM Performed by: Bill SalinasMorelli, Quintyn Dombek A, PA-C Authorized by: Bill SalinasMorelli, Merik Mignano A, PA-C   Procedure details:    Indications: localization of abscess and evaluate for cellulitis  Transverse view:  Visualized   Longitudinal view:  Visualized   Images: archived   Location:    Location: axilla     Side:  Left Findings:     no abscess present    cellulitis present    no foreign body present Comments:     Cobblestoning consistent with cellulitis.   (including critical care time)  Medications Ordered in ED Medications - No data to display  ED Course  I have reviewed the triage vital signs and the nursing notes.  Pertinent labs & imaging results that were available during my care of the patient were reviewed by me and considered in my medical decision making (see chart for details).    MDM Rules/Calculators/A&P                     43 year old male presents today for recheck of cellulitis of the left axilla.  Area demarcated during previous visit, erythema now extends 2-3 cm past the inferior border.  Ultrasound performed which shows cobblestoning consistent with cellulitis, no abscess identified.  He was unable to obtain his antibiotics until yesterday and has had 4 doses of 400 mg clindamycin so far.  He reports he is otherwise feeling well  today no fever measured at home.  No nausea vomiting or additional concerns today.  Patient is well-appearing, pleasant, nontoxic.  I discussed options with patient including blood work and further imaging today versus continuing outpatient treatment and recheck in 1-2 days.  Shared decision-making was made and patient elects to continue outpatient clindamycin treatment and have the area rechecked either at PCP or in the ED in 1-2 days.  Vital signs stable today no fever or tachycardia, patient has antibiotic at home and will continue take as previously prescribed.  Patient informed to return to the ER immediately for any new or worsening symptoms.  At this time there does not appear to be any evidence of an acute emergency medical condition and the patient appears stable for discharge with appropriate outpatient follow up. Diagnosis was discussed with patient who verbalizes understanding of care plan and is agreeable to discharge. I have discussed return precautions with patient who verbalizes understanding of return precautions. Patient encouraged to follow-up with their PCP. All questions answered.  Patient's case discussed with Dr. Criss Alvine who agrees with plan to discharge with follow-up.   Note: Portions of this report may have been transcribed using voice recognition software. Every effort was made to ensure accuracy; however, inadvertent computerized transcription errors may still be present.  Final Clinical Impression(s) / ED Diagnoses Final diagnoses:  Cellulitis of chest wall    Rx / DC Orders ED Discharge Orders    None       Bill Salinas, PA-C 01/13/19 1145    Pricilla Loveless, MD 01/13/19 1231

## 2019-01-15 ENCOUNTER — Emergency Department (HOSPITAL_BASED_OUTPATIENT_CLINIC_OR_DEPARTMENT_OTHER)
Admission: EM | Admit: 2019-01-15 | Discharge: 2019-01-15 | Disposition: A | Discharge status: Home / Self Care | Payer: Self-pay | Attending: Emergency Medicine | Admitting: Emergency Medicine

## 2019-01-15 ENCOUNTER — Encounter (HOSPITAL_BASED_OUTPATIENT_CLINIC_OR_DEPARTMENT_OTHER): Payer: Self-pay | Admitting: Emergency Medicine

## 2019-01-15 ENCOUNTER — Emergency Department (HOSPITAL_BASED_OUTPATIENT_CLINIC_OR_DEPARTMENT_OTHER): Payer: Self-pay

## 2019-01-15 ENCOUNTER — Other Ambulatory Visit: Payer: Self-pay

## 2019-01-15 DIAGNOSIS — L03114 Cellulitis of left upper limb: Secondary | ICD-10-CM

## 2019-01-15 DIAGNOSIS — L03112 Cellulitis of left axilla: Secondary | ICD-10-CM | POA: Insufficient documentation

## 2019-01-15 DIAGNOSIS — N6332 Unspecified lump in axillary tail of the left breast: Secondary | ICD-10-CM | POA: Insufficient documentation

## 2019-01-15 DIAGNOSIS — R2230 Localized swelling, mass and lump, unspecified upper limb: Secondary | ICD-10-CM

## 2019-01-15 MED ORDER — CEPHALEXIN 500 MG PO CAPS: 500.0000 mg | ORAL_CAPSULE | Freq: Three times a day (TID) | ORAL | 0 refills | Status: AC

## 2019-01-15 MED ORDER — METHOCARBAMOL 500 MG PO TABS: 500.0000 mg | ORAL_TABLET | Freq: Two times a day (BID) | ORAL | 0 refills | Status: DC

## 2019-01-15 NOTE — ED Triage Notes (Signed)
Here for recheck of cellulitis to L axilla. Redness is spreading outside the line from last visit.

## 2019-01-15 NOTE — ED Notes (Addendum)
ED Provider at bedside with [facility] 

## 2019-01-15 NOTE — ED Notes (Signed)
Patient transported to [facility] 

## 2019-01-16 LAB — CULTURE, BLOOD (ROUTINE X 2)
Culture: NO GROWTH
Culture: NO GROWTH
Special Requests: ADEQUATE
Special Requests: ADEQUATE

## 2019-01-17 NOTE — ED Provider Notes (Signed)
[facility] EMERGENCY DEPARTMENT Provider Note   CSN: [id number] Arrival date & time: 01/15/19  1047     History Chief Complaint  Patient presents with  . Wound Check    Angel Graves is a 44 y.o. male.  HPI   44 year old male with continued hip pain and mass in his left axilla.  Recently seen in the emergency room for the same.  Currently on antibiotics and reports compliance.  He feels like that overall the red area has improved in size but he does have enlarging mass.  No drainage.  No fevers or chills.  He is concerned because the pain/location interferes with his ability to work efficiently.   Past Medical History:  Diagnosis Date  . GERD (gastroesophageal reflux disease)     There are no problems to display for this patient.   Past Surgical History:  Procedure Laterality Date  . WRIST SURGERY         No family history on file.  Social History   Tobacco Use  . Smoking status: Never Smoker  . Smokeless tobacco: Never Used  Substance Use Topics  . Alcohol use: No  . Drug use: No    Home Medications Prior to Admission medications   Medication Sig Start Date End Date Taking? Authorizing Provider  benzonatate (TESSALON) 100 MG capsule Take 1 capsule (100 mg total) by mouth every 8 (eight) hours. 02/11/18   Cardama, Amadeo Garnet, MD  cephALEXin (KEFLEX) 500 MG capsule Take 1 capsule (500 mg total) by mouth 3 (three) times daily. 01/15/19   Raeford Razor, MD  clindamycin (CLEOCIN) 150 MG capsule Take 3 capsules (450 mg total) by mouth 3 (three) times daily for 7 days. 01/11/19 01/18/19  Milagros Loll, MD  lidocaine (LIDODERM) 5 % Place 1 patch onto the skin daily. Remove & Discard patch within 12 hours or as directed by MD 08/07/18   Henderly, Britni A, PA-C  methocarbamol (ROBAXIN) 500 MG tablet Take 1 tablet (500 mg total) by mouth 2 (two) times daily. 01/15/19   Raeford Razor, MD  omeprazole (PRILOSEC) 20 MG capsule Take 1 capsule (20 mg total) by mouth  daily. 11/14/18   Robinson, [place] N, PA-C  sucralfate (CARAFATE) 1 GM/10ML suspension Take 10 mLs (1 g total) by mouth 4 (four) times daily -  with meals and at bedtime. 11/14/18   Robinson, [place] N, PA-C    Allergies    Patient has no known allergies.  Review of Systems   Review of Systems   All systems reviewed and negative, other than as noted in HPI.   Physical Exam Updated Vital Signs BP 112/81 (BP Location: Right Arm)   Pulse 74   Temp 98.7 F (37.1 C) (Oral)   Resp 18   Ht 6\' 4"  (1.93 m)   Wt 113.4 kg   SpO2 100%   BMI 30.43 kg/m   Physical Exam Vitals and nursing note reviewed.  Constitutional:      General: He is not in acute distress.    Appearance: He is well-developed.  HENT:     Head: Normocephalic and atraumatic.  Eyes:     General:        Right eye: No discharge.        Left eye: No discharge.     Conjunctiva/sclera: Conjunctivae normal.  Cardiovascular:     Rate and Rhythm: Normal rate and regular rhythm.     Heart sounds: Normal heart sounds. No murmur. No friction rub. No gallop.  Pulmonary:     Effort: Pulmonary effort is normal. No respiratory distress.     Breath sounds: Normal breath sounds.  Abdominal:     General: There is no distension.     Palpations: Abdomen is soft.     Tenderness: There is no abdominal tenderness.  Musculoskeletal:     Cervical back: Neck supple.     Comments: Left axilla with a large mobile, subcutaneous mass.  About the size of a hockey puck.  It does not feel fluctuant.  It is not as tender as I would suspect typical abscess to be.  There is surrounding cellulitic changes.  Skin:    General: Skin is warm and dry.  Neurological:     Mental Status: He is alert.  Psychiatric:        Behavior: Behavior normal.        Thought Content: Thought content normal.     ED Results / Procedures / Treatments   Labs (all labs ordered are listed, but only abnormal results are displayed) Labs Reviewed - No data to  display  EKG None  Radiology [place] CHEST SOFT TISSUE  Result Date: 01/15/2019 CLINICAL DATA:  Left arm cellulitis EXAM: ULTRASOUND OF HEAD/NECK SOFT TISSUES TECHNIQUE: Ultrasound examination of the head and neck soft tissues was performed in the area of clinical concern. COMPARISON:  01/11/2019 FINDINGS: Generalized subcutaneous edema is noted in the area of clinical concern consistent with that seen on recent CT examination. No focal fluid collection to suggest abscess is noted. IMPRESSION: Changes consistent with focal cellulitis.  No abscess is seen. Electronically Signed   By: Inez Catalina M.D.   On: 01/15/2019 13:20    Procedures Procedures (including critical care time)  Medications Ordered in ED Medications - No data to display  ED Course  I have reviewed the triage vital signs and the nursing notes.  Pertinent labs & imaging results that were available during my care of the patient were reviewed by me and considered in my medical decision making (see chart for details).    MDM Rules/Calculators/A&P                      44 year old male with cellulitis of his left axilla.  Recent imaging including CT and now [place] w/o clear abscess. Continued abx. Recheck in a couple days.  Work note provided.   Final Clinical Impression(s) / ED Diagnoses Final diagnoses:  Mass in armpit  Cellulitis of left upper extremity    Rx / DC Orders ED Discharge Orders         Ordered    methocarbamol (ROBAXIN) 500 MG tablet  2 times daily     01/15/19 1414    cephALEXin (KEFLEX) 500 MG capsule  3 times daily     01/15/19 1414           Virgel Manifold, MD 01/18/19 [phone]

## 2019-01-18 ENCOUNTER — Emergency Department (HOSPITAL_BASED_OUTPATIENT_CLINIC_OR_DEPARTMENT_OTHER)
Admission: EM | Admit: 2019-01-18 | Discharge: 2019-01-18 | Disposition: A | Discharge status: Home / Self Care | Payer: Self-pay | Attending: Emergency Medicine | Admitting: Emergency Medicine

## 2019-01-18 ENCOUNTER — Other Ambulatory Visit: Payer: Self-pay

## 2019-01-18 DIAGNOSIS — L03313 Cellulitis of chest wall: Secondary | ICD-10-CM | POA: Insufficient documentation

## 2019-01-18 DIAGNOSIS — Z5189 Encounter for other specified aftercare: Secondary | ICD-10-CM | POA: Insufficient documentation

## 2019-01-18 DIAGNOSIS — Z79899 Other long term (current) drug therapy: Secondary | ICD-10-CM | POA: Insufficient documentation

## 2019-01-18 LAB — CBC WITH DIFFERENTIAL/PLATELET
Abs Immature Granulocytes: 0.06 10*3/uL (ref 0.00–0.07)
Basophils Absolute: 0.1 10*3/uL (ref 0.0–0.1)
Basophils Relative: 1 %
Eosinophils Absolute: 0 10*3/uL (ref 0.0–0.5)
Eosinophils Relative: 0 %
HCT: 49.6 % (ref 39.0–52.0)
Hemoglobin: 15.8 g/dL (ref 13.0–17.0)
Immature Granulocytes: 1 %
Lymphocytes Relative: 33 %
Lymphs Abs: 2.2 10*3/uL (ref 0.7–4.0)
MCH: 25.6 pg — ABNORMAL LOW (ref 26.0–34.0)
MCHC: 31.9 g/dL (ref 30.0–36.0)
MCV: 80.3 fL (ref 80.0–100.0)
Monocytes Absolute: 0.4 10*3/uL (ref 0.1–1.0)
Monocytes Relative: 6 %
Neutro Abs: 3.9 10*3/uL (ref 1.7–7.7)
Neutrophils Relative %: 59 %
Platelets: 300 10*3/uL (ref 150–400)
RBC: 6.18 MIL/uL — ABNORMAL HIGH (ref 4.22–5.81)
RDW: 14.8 % (ref 11.5–15.5)
WBC: 6.6 10*3/uL (ref 4.0–10.5)
nRBC: 0 % (ref 0.0–0.2)

## 2019-01-18 LAB — LACTIC ACID, PLASMA: Lactic Acid, Venous: 1.1 mmol/L (ref 0.5–1.9)

## 2019-01-18 LAB — BASIC METABOLIC PANEL
Anion gap: 8 (ref 5–15)
BUN: 8 mg/dL (ref 6–20)
CO2: 26 mmol/L (ref 22–32)
Calcium: 8.9 mg/dL (ref 8.9–10.3)
Chloride: 102 mmol/L (ref 98–111)
Creatinine, Ser: 0.7 mg/dL (ref 0.61–1.24)
GFR calc Af Amer: >60 mL/min (ref >60)
GFR calc non Af Amer: >60 mL/min (ref >60)
Glucose, Bld: 90 mg/dL (ref 70–99)
Potassium: 4 mmol/L (ref 3.5–5.1)
Sodium: 136 mmol/L (ref 135–145)

## 2019-01-18 MED ORDER — LIDOCAINE-EPINEPHRINE (PF) 2 %-1:200000 IJ SOLN
10.0000 mL | Freq: Once | INTRAMUSCULAR | Status: AC
  Administered 2019-01-18: 10 mL
  Filled 2019-01-18: qty 10

## 2019-01-18 NOTE — ED Provider Notes (Signed)
[facility] EMERGENCY DEPARTMENT Provider Note   CSN: [id number] Arrival date & time: 01/18/19  1022     History Chief Complaint  Patient presents with  . Wound Check    Angel Graves is a 44 y.o. male with PMHx GERD who presents to the ED today for wound recheck. This is pt's 4th ED visit regarding his left axilla cellulitis. He reports he is complaint of his Keflex and states he has 3.5 days left. Pt states that the area has slightly decreased in size from initial ED visit on 12/29 but it has been present for almost 2 weeks now and is getting in the way of his working; pt works on a [employer]. Pt denies fever, chills, drainage.   01/11/2019: Initial ED visit CBC with elevated leukocytosis at 14,000. Normal lactic acid. Blood cultures collected without growth.  CT chest with findings:  1. Incomplete inclusion of the right thorax 2. Edema within the subcutaneous fat of the left upper posterolateral chest wall in the region demarcated by a BB, felt consistent with cellulitis. Negative for rim enhancing fluid collection to suggest abscess. 3. Mild gynecomastia Pt was started on Clindamycin 450 mg TID x 7 days and told to return to the ED in 1-2 days for wound check.   01/13/2019: Erythema extended 2-3 cm past inferior border of previous surgical pen demarcation. Bedside ultrasound with no abscess appreciated. Pt encouraged to continue his antibiotics and to return for wound recheck in 1-2 days.   01/14/2018: [place] Chest Soft Tissue with findings: Changes consistent with focal cellulitis.  No abscess is seen Pt switched to Keflex 500 mg TID and told to return for wound recheck in 1-2 days.    The history is provided by the patient and medical records.       Past Medical History:  Diagnosis Date  . GERD (gastroesophageal reflux disease)     There are no problems to display for this patient.   Past Surgical History:  Procedure Laterality Date  . WRIST SURGERY          No family history on file.  Social History   Tobacco Use  . Smoking status: Never Smoker  . Smokeless tobacco: Never Used  Substance Use Topics  . Alcohol use: No  . Drug use: No    Home Medications Prior to Admission medications   Medication Sig Start Date End Date Taking? Authorizing Provider  benzonatate (TESSALON) 100 MG capsule Take 1 capsule (100 mg total) by mouth every 8 (eight) hours. 02/11/18   Cardama, Grayce Sessions, MD  cephALEXin (KEFLEX) 500 MG capsule Take 1 capsule (500 mg total) by mouth 3 (three) times daily. 01/15/19   Virgel Manifold, MD  clindamycin (CLEOCIN) 150 MG capsule Take 3 capsules (450 mg total) by mouth 3 (three) times daily for 7 days. 01/11/19 01/18/19  Lucrezia Starch, MD  lidocaine (LIDODERM) 5 % Place 1 patch onto the skin daily. Remove & Discard patch within 12 hours or as directed by MD 08/07/18   Henderly, Britni A, PA-C  methocarbamol (ROBAXIN) 500 MG tablet Take 1 tablet (500 mg total) by mouth 2 (two) times daily. 01/15/19   Virgel Manifold, MD  omeprazole (PRILOSEC) 20 MG capsule Take 1 capsule (20 mg total) by mouth daily. 11/14/18   Robinson, [place] N, PA-C  sucralfate (CARAFATE) 1 GM/10ML suspension Take 10 mLs (1 g total) by mouth 4 (four) times daily -  with meals and at bedtime. 11/14/18   Robinson, [place]  N, PA-C    Allergies    Patient has no known allergies.  Review of Systems   Review of Systems  Constitutional: Negative for chills and fever.  Skin: Positive for color change.  All other systems reviewed and are negative.   Physical Exam Updated Vital Signs BP 129/77 (BP Location: Left Arm)   Pulse 81   Temp 99.1 F (37.3 C) (Oral)   Resp 18   SpO2 100%   Physical Exam Vitals and nursing note reviewed.  Constitutional:      Appearance: He is not ill-appearing.  HENT:     Head: Normocephalic and atraumatic.  Eyes:     Conjunctiva/sclera: Conjunctivae normal.  Cardiovascular:     Rate and Rhythm: Normal rate and  regular rhythm.  Pulmonary:     Effort: Pulmonary effort is normal.     Breath sounds: Normal breath sounds.  Abdominal:     Palpations: Abdomen is soft.     Tenderness: There is no abdominal tenderness.  Musculoskeletal:     Cervical back: Neck supple.  Skin:    General: Skin is warm and dry.     Comments: 10 cm x 6 cm area of induration noted to left lateral chest wall with overlying erythema. Mild tenderness to palpation. No obvious fluctuance appreciated. Old surgical pen demarcation noted without any erythema past demarcation.   Neurological:     Mental Status: He is alert.     ED Results / Procedures / Treatments   Labs (all labs ordered are listed, but only abnormal results are displayed) Labs Reviewed  CBC WITH DIFFERENTIAL/PLATELET - Abnormal; Notable for the following components:      Result Value   RBC 6.18 (*)    MCH 25.6 (*)    All other components within normal limits  BASIC METABOLIC PANEL  LACTIC ACID, PLASMA  LACTIC ACID, PLASMA    EKG None  Radiology No results found.  Procedures .Marland KitchenIncision and Drainage  Date/Time: 01/18/2019 1:57 PM Performed by: Tanda Rockers, PA-C Authorized by: Tanda Rockers, PA-C   Consent:    Consent obtained:  Verbal   Consent given by:  Patient   Risks discussed:  Bleeding, incomplete drainage, pain and infection Location:    Size:  10 x 6 cm   Location:  Trunk   Trunk location:  Chest Pre-procedure details:    Skin preparation:  Betadine Anesthesia (see MAR for exact dosages):    Anesthesia method:  Local infiltration   Local anesthetic:  Lidocaine 2% WITH epi Procedure type:    Complexity:  Simple Procedure details:    Needle aspiration: yes     Needle size:  20 G   Drainage characteristics: none. Post-procedure details:    Patient tolerance of procedure:  Tolerated well, no immediate complications   (including critical care time)  Medications Ordered in ED Medications  lidocaine-EPINEPHrine  (XYLOCAINE W/EPI) 2 %-1:200000 (PF) injection 10 mL (has no administration in time range)    ED Course  I have reviewed the triage vital signs and the nursing notes.  Pertinent labs & imaging results that were available during my care of the patient were reviewed by me and considered in my medical decision making (see chart for details).  44 year old male who returns to the ED today for wound check.  This is his fourth visit in the past week for large indurated area with cellulitis to left lateral chest wall just inferior to the axilla.  Is been on clindamycin and then switched to  Keflex on 1/02.  Multiple imaging obtained including CT chest, bedside ultrasound, official ultrasound with findings of cellulitis and no obvious drainable abscess.  He reports that the area has decreased in size but it continues to bother him.  On exam there is a large 10 x 6 cm area of induration and overlying erythema to the left lateral chest wall with mild tenderness to palpation.  Patient's vitals today include a temp of 99.1.  He is tachycardic and nontachypneic.  He does not appear to be toxic today.  She has not had lab work done since initial visit on 12/29.  Will repeat today.  Will attempt needle aspiration to see if there is anything to be drained today. Discussed case with attending physician Dr. Anitra Lauth who has evaluated patient as well and agrees with plan.   Lab work reassuring.  CBC without leukocytosis.  On ED visit 12/29 patient did have leukocytosis of 14,000. No electrolyte abnormalities. Lactic acid within normal limits.   Attempted needle aspiration without any drainage. Given this is pt's 4th ED visit feel he would benefit from general surgery evaluation. Will discharge home with referral. He is advised to continue his antibiotics as well. Pt is in agreement with plan and stable for discharge home.   This note was prepared using Dragon voice recognition software and may include unintentional  dictation errors due to the inherent limitations of voice recognition software.     MDM Rules/Calculators/A&P                       Final Clinical Impression(s) / ED Diagnoses Final diagnoses:  Visit for wound check  Cellulitis of chest wall    Rx / DC Orders ED Discharge Orders    None       Discharge Instructions     Continue taking your antibiotics as prescribed.   Please call [facility] to schedule an appointment for further evaluation.        Tanda Rockers, PA-C 01/18/19 1402    Gwyneth Sprout, MD 01/18/19 2018

## 2019-01-18 NOTE — Discharge Instructions (Signed)
Continue taking your antibiotics as prescribed.   Please call [facility] to schedule an appointment for further evaluation.

## 2019-01-18 NOTE — ED Triage Notes (Signed)
Recheck of L axilla abscess and cellulitis. Ongoing swelling and pain.

## 2019-01-18 NOTE — ED Notes (Signed)
Orange size red  Swollen area under left arm , pt states it is smaller  softball

## 2020-02-04 IMAGING — CT CT ABD-PELV W/ CM
2 of 5 series · 16 of 46 positions shown, 18 images · IV contrast (Omnipaque)
Comparison: None.

CLINICAL DATA: 43-year-old with acute abdominal pain.

EXAM:
CT ABDOMEN AND PELVIS WITH CONTRAST
TECHNIQUE: Multidetector CT imaging of the abdomen and pelvis was performed
using the standard protocol following bolus administration of
intravenous contrast.
CONTRAST:  100mL OMNIPAQUE IOHEXOL 300 MG/ML  SOLN

[Series 2: axial st · axial · 0.98mm/px · z∈[-512,-42]mm · 13 of 106 slices shown, 15 images]
[im 6/106  soft-tissue]
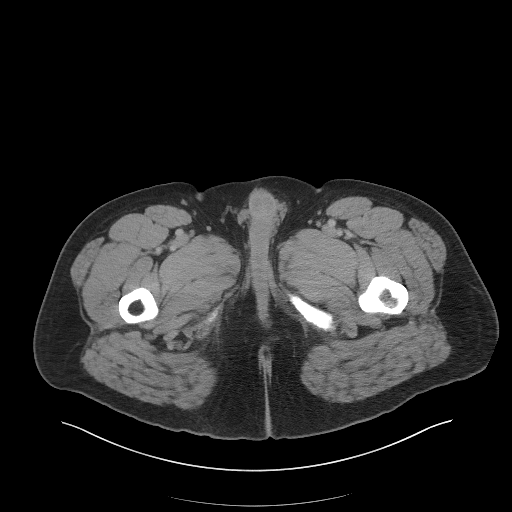
[im 6/106  bone]
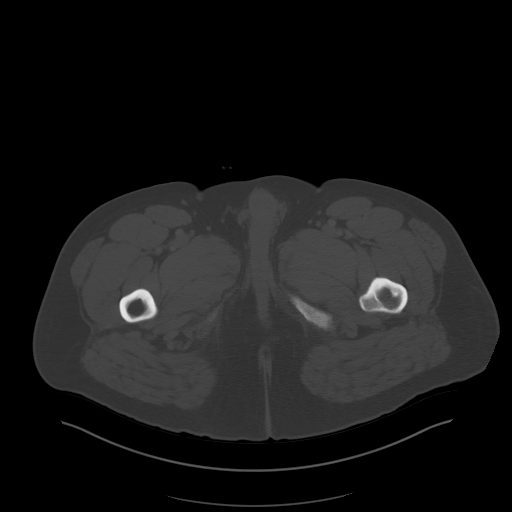
[im 17/106  soft-tissue]
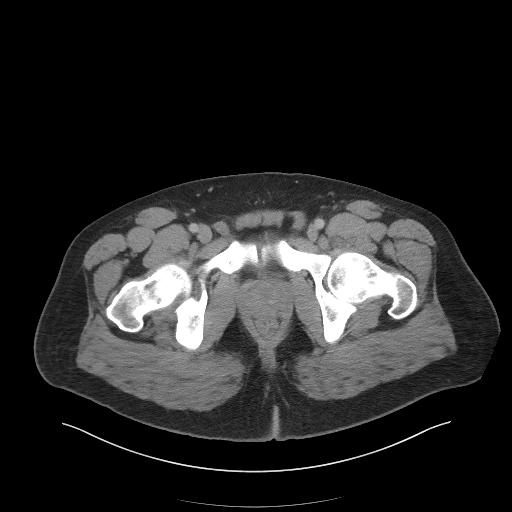
[im 23/106  soft-tissue]
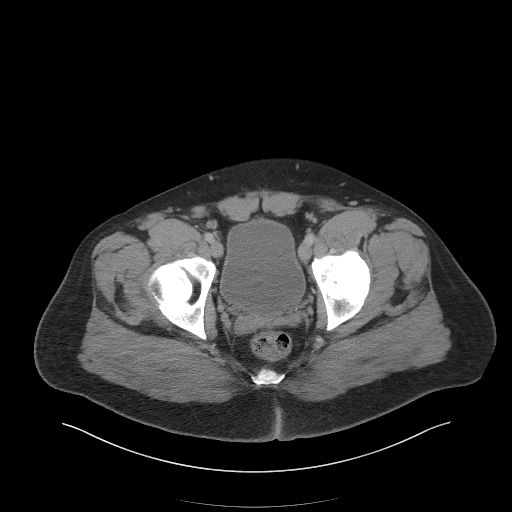
[im 28/106  soft-tissue]
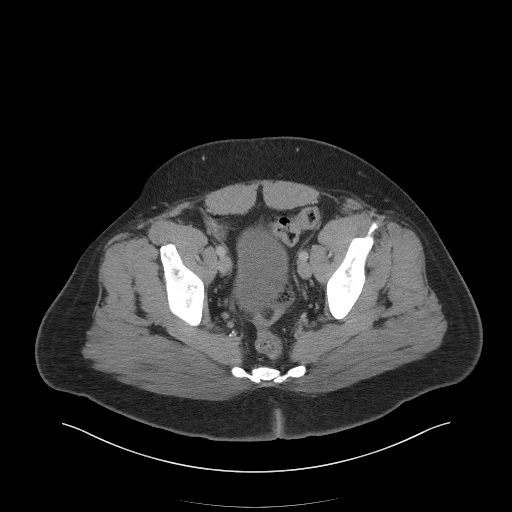
[im 39/106  soft-tissue]
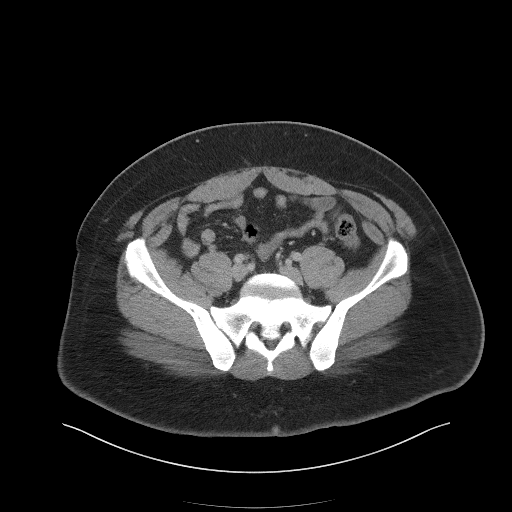
[im 45/106  soft-tissue]
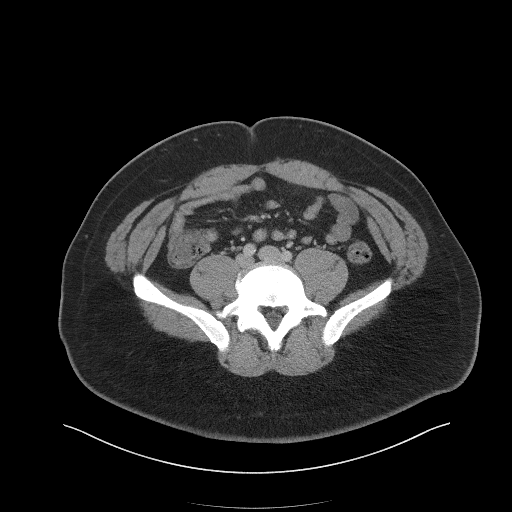
[im 56/106  soft-tissue]
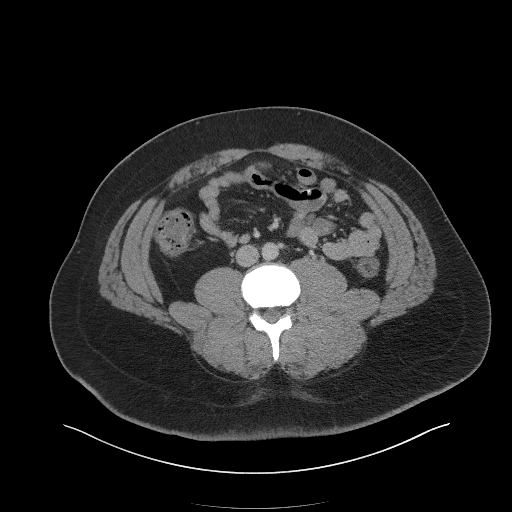
[im 61/106  soft-tissue]
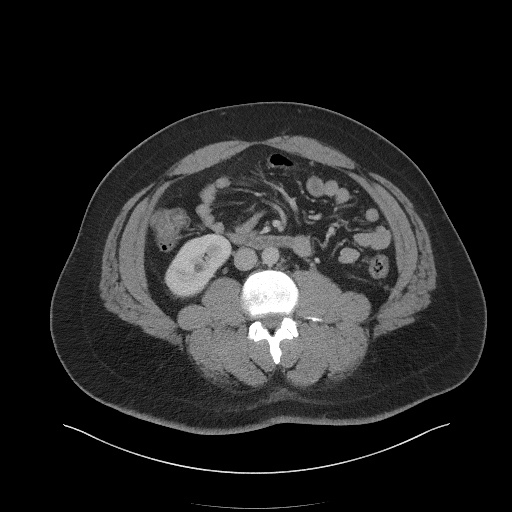
[im 67/106  soft-tissue]
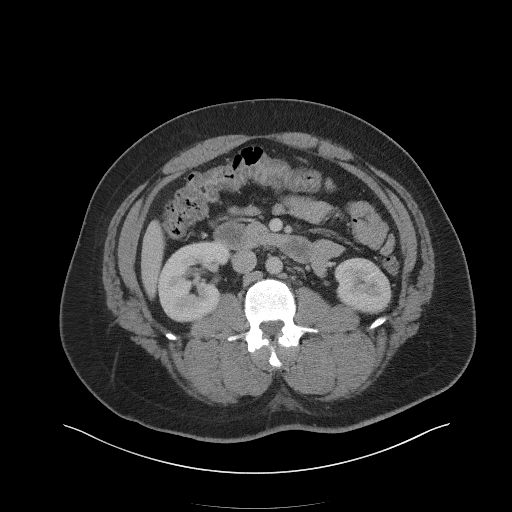
[im 67/106  bone]
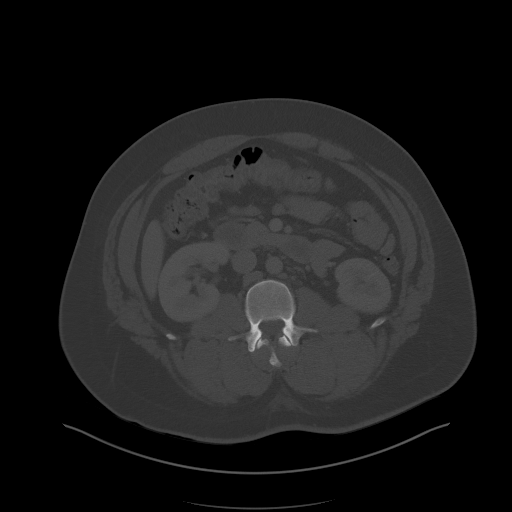
[im 78/106  soft-tissue]
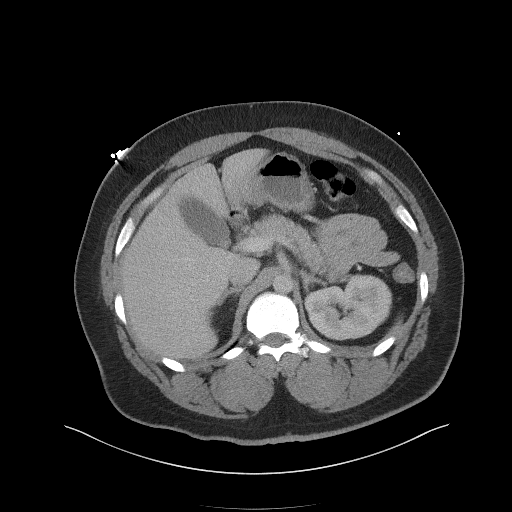
[im 83/106  soft-tissue]
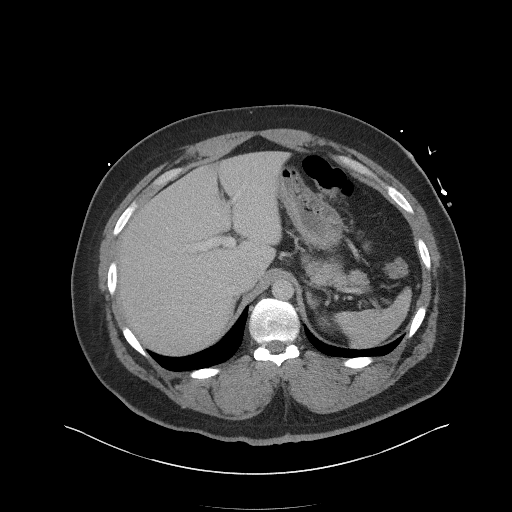
[im 89/106  soft-tissue]
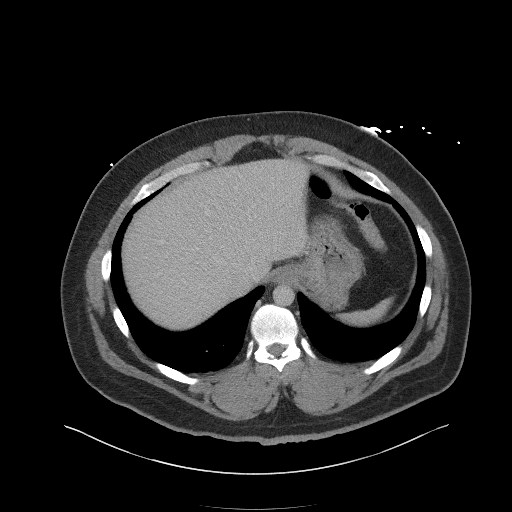
[im 100/106  soft-tissue]
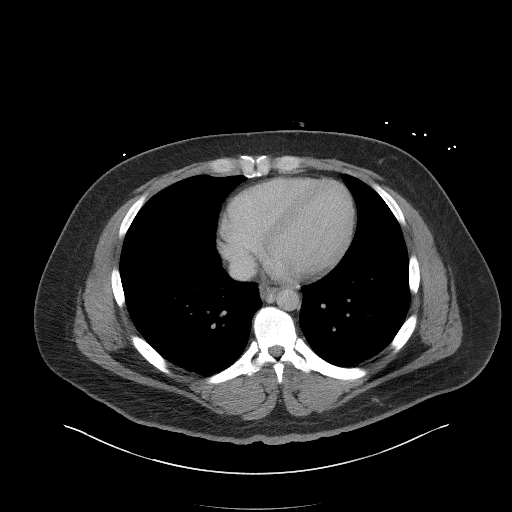

[Series 5: coronal st · coronal · 0.85mm/px · 3 of 101 slices shown]
[im 34/101  soft-tissue]
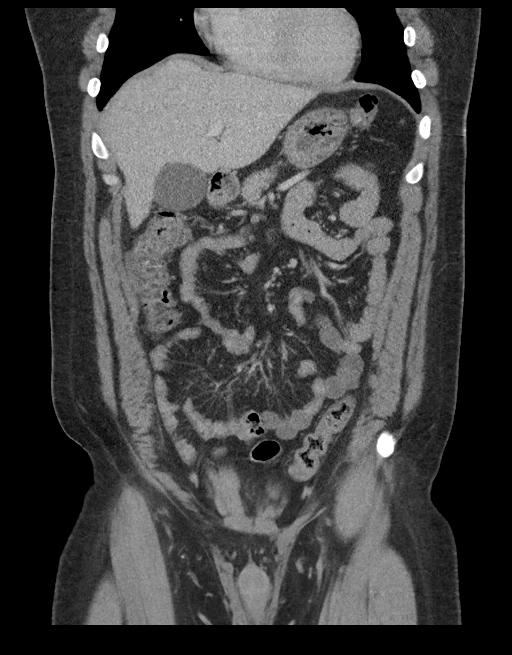
[im 45/101  soft-tissue]
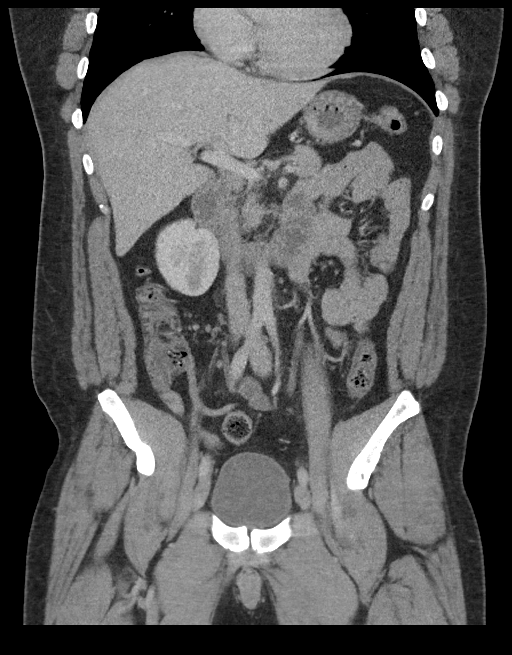
[im 56/101  soft-tissue]
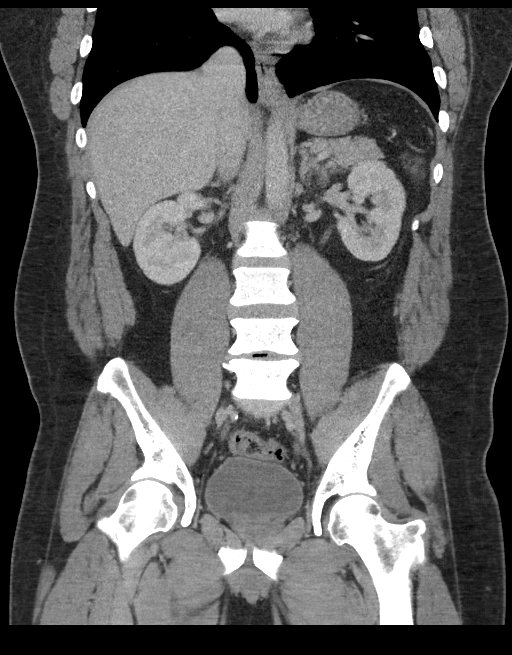

[16 of 46 positions shown; findings below may reference images not displayed]

FINDINGS: Lower chest: Lung bases are clear.

Hepatobiliary: 1.1 cm low-density structure along the anterior left
hepatic lobe probably represents a cyst. No other focal liver
lesions. Normal appearance of the gallbladder. Portal venous system
is patent. No biliary dilatation.

Pancreas: Unremarkable. No pancreatic ductal dilatation or
surrounding inflammatory changes.

Spleen: Normal in size without focal abnormality.

Adrenals/Urinary Tract: Normal adrenal glands. Probable small cyst
along the right kidney lower pole but too small to definitively
characterize. No suspicious renal lesions. No hydronephrosis. Normal
appearance of the urinary bladder.

Stomach/Bowel: Stomach is within normal limits. Appendix appears
normal. No evidence of bowel wall thickening, distention, or
inflammatory changes.

Vascular/Lymphatic: Minimal atherosclerotic disease in the right
common iliac artery. Normal caliber of the abdominal aorta. Main
visceral arteries are patent. Venous structures are unremarkable. No
abdominopelvic lymphadenopathy.

Reproductive: Prostate is unremarkable.

Other: Negative for ascites.  Negative for free air.

Musculoskeletal: Mild disc space narrowing at L4-L5. No acute bone
abnormality.
IMPRESSION: No acute abnormality in the abdomen or pelvis.

Probable hepatic and renal cysts as described.

## 2020-02-12 ENCOUNTER — Other Ambulatory Visit: Payer: Self-pay

## 2020-02-12 ENCOUNTER — Emergency Department (HOSPITAL_BASED_OUTPATIENT_CLINIC_OR_DEPARTMENT_OTHER)
Admission: EM | Admit: 2020-02-12 | Discharge: 2020-02-12 | Disposition: A | Discharge status: Home / Self Care | Payer: Self-pay | Attending: Emergency Medicine | Admitting: Emergency Medicine

## 2020-02-12 ENCOUNTER — Encounter (HOSPITAL_BASED_OUTPATIENT_CLINIC_OR_DEPARTMENT_OTHER): Payer: Self-pay | Admitting: Emergency Medicine

## 2020-02-12 DIAGNOSIS — K0889 Other specified disorders of teeth and supporting structures: Secondary | ICD-10-CM | POA: Insufficient documentation

## 2020-02-12 MED ORDER — LIDOCAINE VISCOUS HCL 2 % MT SOLN: 15.0000 mL | OROMUCOSAL | 0 refills | Status: AC | PRN

## 2020-02-12 MED ORDER — NAPROXEN 500 MG PO TABS: 500.0000 mg | ORAL_TABLET | Freq: Two times a day (BID) | ORAL | 0 refills | Status: AC

## 2020-02-12 NOTE — Discharge Instructions (Signed)
Take naproxen 2 times a day with meals.  Do not take other anti-inflammatories at the same time (Advil, Motrin, ibuprofen, Aleve). You may supplement with Tylenol if you need further pain control. Use viscous lidocaine to help with pain. Follow-up with your dentist at your appointment tomorrow. Return to the emergency room if you develop high fevers, severe worsening pain, any new, worsening, or concerning symptoms.

## 2020-02-12 NOTE — ED Triage Notes (Signed)
Right sided upper and lower dental pain - reports known wisdom teeth that need to come out. Appointment with dentist TOMORROW. States advil effective for pain.

## 2020-02-12 NOTE — ED Provider Notes (Signed)
[facility] EMERGENCY DEPARTMENT Provider Note   CSN: [id number] Arrival date & time: 02/12/20  2122     History Chief Complaint  Patient presents with  . Dental Pain    Angel Graves is a 45 y.o. male presenting for evaluation of dental pain.  Patient states in the past year he has had intermittent dental pain.  He states pain will be present, and then go away for several months.  Over the past week, pain has been constant.  He has followed up with a dentist already, has been told that he needs management of his wisdom teeth.  His pain is on the right side, both upper and lower.  It causes an associated headache.  He has been taking ibuprofen, which eventually helped his pain, though takes several hours.  He is also using Orajel.  He denies fevers or chills.  No trauma or injury.  No difficulty swallowing or change in voice.  He has a history of heartburn for which he takes medication, no other medical problems. Patient states he is currently pain-free.  He is here today to get some medication to make it through until tomorrow's appointment.  HPI     Past Medical History:  Diagnosis Date  . GERD (gastroesophageal reflux disease)     There are no problems to display for this patient.   Past Surgical History:  Procedure Laterality Date  . WRIST SURGERY         History reviewed. No pertinent family history.  Social History   Tobacco Use  . Smoking status: Never Smoker  . Smokeless tobacco: Never Used  Substance Use Topics  . Alcohol use: No  . Drug use: No    Home Medications Prior to Admission medications   Medication Sig Start Date End Date Taking? Authorizing Provider  lidocaine (XYLOCAINE) 2 % solution Use as directed 15 mLs in the mouth or throat as needed for mouth pain. 02/12/20  Yes Harlo Fabela, PA-C  naproxen (NAPROSYN) 500 MG tablet Take 1 tablet (500 mg total) by mouth 2 (two) times daily with a meal. 02/12/20  Yes Rhythm Wigfall, PA-C   benzonatate (TESSALON) 100 MG capsule Take 1 capsule (100 mg total) by mouth every 8 (eight) hours. 02/11/18   Cardama, Amadeo Garnet, MD  cephALEXin (KEFLEX) 500 MG capsule Take 1 capsule (500 mg total) by mouth 3 (three) times daily. 01/15/19   Raeford Razor, MD  lidocaine (LIDODERM) 5 % Place 1 patch onto the skin daily. Remove & Discard patch within 12 hours or as directed by MD 08/07/18   Henderly, Britni A, PA-C  methocarbamol (ROBAXIN) 500 MG tablet Take 1 tablet (500 mg total) by mouth 2 (two) times daily. 01/15/19   Raeford Razor, MD  omeprazole (PRILOSEC) 20 MG capsule Take 1 capsule (20 mg total) by mouth daily. 11/14/18   Robinson, [place] N, PA-C  sucralfate (CARAFATE) 1 GM/10ML suspension Take 10 mLs (1 g total) by mouth 4 (four) times daily -  with meals and at bedtime. 11/14/18   Robinson, [place] N, PA-C    Allergies    Patient has no known allergies.  Review of Systems   Review of Systems  Constitutional: Negative for fever.  HENT: Positive for dental problem.     Physical Exam Updated Vital Signs BP (!) 145/101   Pulse 82   Temp 98.4 F (36.9 C) (Oral)   Resp 18   SpO2 99%   Physical Exam Vitals and nursing note reviewed.  Constitutional:  General: He is not in acute distress.    Appearance: He is well-developed and well-nourished.     Comments: Sitting comfortably in the bed in no acute distress  HENT:     Head: Normocephalic and atraumatic.     Mouth/Throat:      Comments: No ttp of the teeth. Mild gum edema of the upper and lower back teeth. No trismus.  Eyes:     Extraocular Movements: EOM normal.  Cardiovascular:     Rate and Rhythm: Normal rate and regular rhythm.     Pulses: Normal pulses.  Pulmonary:     Effort: Pulmonary effort is normal.     Breath sounds: Normal breath sounds.  Abdominal:     General: There is no distension.  Musculoskeletal:        General: Normal range of motion.     Cervical back: Normal range of motion.  Skin:     General: Skin is warm.     Findings: No rash.  Neurological:     Mental Status: He is alert and oriented to person, place, and time.  Psychiatric:        Mood and Affect: Mood and affect normal.     ED Results / Procedures / Treatments   Labs (all labs ordered are listed, but only abnormal results are displayed) Labs Reviewed - No data to display  EKG None  Radiology No results found.  Procedures Procedures   Medications Ordered in ED Medications - No data to display  ED Course  I have reviewed the triage vital signs and the nursing notes.  Pertinent labs & imaging results that were available during my care of the patient were reviewed by me and considered in my medical decision making (see chart for details).    MDM Rules/Calculators/A&P                          Pt presenting for evaluation of dental pain. Currently pain free. No fevers or signs of infection. Has an apt tomorrow. On exam, pt appears nontoxic. No signs of concerning infection including ludwigs. Will tx with nsaids and viscous lidocaine, hav ept f/u tomorrow with dentist. At this time, pt appears safe for d/c. Return precautions given. Pt states he understands and agrees to plan.   Final Clinical Impression(s) / ED Diagnoses Final diagnoses:  Pain, dental    Rx / DC Orders ED Discharge Orders         Ordered    naproxen (NAPROSYN) 500 MG tablet  2 times daily with meals        02/12/20 2303    lidocaine (XYLOCAINE) 2 % solution  As needed        02/12/20 2303           Alveria Apley, PA-C 02/12/20 2317    Rolan Bucco, MD 02/12/20 2341

## 2020-02-25 ENCOUNTER — Emergency Department (HOSPITAL_BASED_OUTPATIENT_CLINIC_OR_DEPARTMENT_OTHER)
Admission: EM | Admit: 2020-02-25 | Discharge: 2020-02-25 | Disposition: A | Discharge status: Home / Self Care | Payer: Self-pay | Attending: Emergency Medicine | Admitting: Emergency Medicine

## 2020-02-25 ENCOUNTER — Other Ambulatory Visit: Payer: Self-pay

## 2020-02-25 ENCOUNTER — Encounter (HOSPITAL_BASED_OUTPATIENT_CLINIC_OR_DEPARTMENT_OTHER): Payer: Self-pay | Admitting: Emergency Medicine

## 2020-02-25 DIAGNOSIS — X509XXA Other and unspecified overexertion or strenuous movements or postures, initial encounter: Secondary | ICD-10-CM | POA: Insufficient documentation

## 2020-02-25 DIAGNOSIS — M5442 Lumbago with sciatica, left side: Secondary | ICD-10-CM | POA: Insufficient documentation

## 2020-02-25 MED ORDER — PREDNISONE 20 MG PO TABS: 40.0000 mg | ORAL_TABLET | Freq: Every day | ORAL | 0 refills | Status: AC

## 2020-02-25 MED ORDER — KETOROLAC TROMETHAMINE 60 MG/2ML IM SOLN
60.0000 mg | Freq: Once | INTRAMUSCULAR | Status: AC
  Administered 2020-02-25: 60 mg via INTRAMUSCULAR
  Filled 2020-02-25: qty 2

## 2020-02-25 MED ORDER — METHOCARBAMOL 500 MG PO TABS: 500.0000 mg | ORAL_TABLET | Freq: Two times a day (BID) | ORAL | 0 refills | Status: AC

## 2020-02-25 NOTE — ED Provider Notes (Signed)
[facility] EMERGENCY DEPARTMENT Provider Note   CSN: [id number] Arrival date & time: 02/25/20  1052     History Chief Complaint  Patient presents with  . Leg Pain    Angel Graves is a 45 y.o. male presents with GERD who presents for evaluation of lower back pain that radiates into his left lower extremity x2 days.  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  He reports that he has had lower back issues before and has been told that he has a pinched nerve.  He reports that he has been using lidocaine patches as well as over-the-counter medications with minimal improvement.  No history of trauma, fall, injury.  He has still been able to ambulate.  He reports worsening pain when he tries to move, bend.  He states that the pain starts in his lower back and radiates down the posterior aspect of his right lower leg.  No numbness/weakness of his extremities. Denies fevers, weight loss, numbness/weakness of upper and lower extremities, bowel/bladder incontinence, saddle anesthesia, history of back surgery, history of IVDA.  The history is provided by the patient.       Past Medical History:  Diagnosis Date  . GERD (gastroesophageal reflux disease)     There are no problems to display for this patient.   Past Surgical History:  Procedure Laterality Date  . WRIST SURGERY         History reviewed. No pertinent family history.  Social History   Tobacco Use  . Smoking status: Never Smoker  . Smokeless tobacco: Never Used  Substance Use Topics  . Alcohol use: No  . Drug use: No    Home Medications Prior to Admission medications   Medication Sig Start Date End Date Taking? Authorizing Provider  methocarbamol (ROBAXIN) 500 MG tablet Take 1 tablet (500 mg total) by mouth 2 (two) times daily. 02/25/20  Yes Maxwell Caul, PA-C  predniSONE (DELTASONE) 20 MG tablet Take 2 tablets (40 mg total) by mouth daily for 4  days. 02/25/20 02/29/20 Yes Maxwell Caul, PA-C  benzonatate (TESSALON) 100 MG capsule Take 1 capsule (100 mg total) by mouth every 8 (eight) hours. 02/11/18   Cardama, Amadeo Garnet, MD  cephALEXin (KEFLEX) 500 MG capsule Take 1 capsule (500 mg total) by mouth 3 (three) times daily. 01/15/19   Raeford Razor, MD  lidocaine (LIDODERM) 5 % Place 1 patch onto the skin daily. Remove & Discard patch within 12 hours or as directed by MD 08/07/18   Henderly, Britni A, PA-C  lidocaine (XYLOCAINE) 2 % solution Use as directed 15 mLs in the mouth or throat as needed for mouth pain. 02/12/20   Caccavale, Sophia, PA-C  naproxen (NAPROSYN) 500 MG tablet Take 1 tablet (500 mg total) by mouth 2 (two) times daily with a meal. 02/12/20   Caccavale, Sophia, PA-C  omeprazole (PRILOSEC) 20 MG capsule Take 1 capsule (20 mg total) by mouth daily. 11/14/18   Robinson, [place] N, PA-C  sucralfate (CARAFATE) 1 GM/10ML suspension Take 10 mLs (1 g total) by mouth 4 (four) times daily -  with meals and at bedtime. 11/14/18   Robinson, [place] N, PA-C    Allergies    Patient has no known allergies.  Review of Systems   Review of Systems  Constitutional: Negative for fever.  Musculoskeletal: Positive for back pain.  Neurological: Negative for weakness and numbness.  All other systems reviewed and are negative.  Physical Exam Updated Vital Signs BP (!) 153/103 (BP Location: Right Arm)   Pulse 70   Temp 97.9 F (36.6 C) (Oral)   Resp 14   Ht 6\' 4"  (1.93 m)   Wt 115.2 kg   SpO2 98%   BMI 30.92 kg/m   Physical Exam Vitals and nursing note reviewed.  Constitutional:      Appearance: He is well-developed and well-nourished.  HENT:     Head: Normocephalic and atraumatic.  Eyes:     General: No scleral icterus.       Right eye: No discharge.        Left eye: No discharge.     Extraocular Movements: EOM normal.     Conjunctiva/sclera: Conjunctivae normal.  Pulmonary:     Effort: Pulmonary effort is normal.   Musculoskeletal:       Back:     Comments: Diffuse tenderness into the lower lumbar region, the paraspinal tenderness on the left lower extremity radiation into the gluteal area.  Skin:    General: Skin is warm and dry.  Neurological:     Mental Status: He is alert.     Comments: Follows commands, Moves all extremities  5/5 strength to BUE and BLE  Sensation intact throughout all major nerve distributions Positive SLR on the left.   Psychiatric:        Mood and Affect: Mood and affect normal.        Speech: Speech normal.        Behavior: Behavior normal.     ED Results / Procedures / Treatments   Labs (all labs ordered are listed, but only abnormal results are displayed) Labs Reviewed - No data to display  EKG None  Radiology No results found.  Procedures Procedures   Medications Ordered in ED Medications  ketorolac (TORADOL) injection 60 mg (60 mg Intramuscular Given 02/25/20 1246)    ED Course  I have reviewed the triage vital signs and the nursing notes.  Pertinent labs & imaging results that were available during my care of the patient were reviewed by me and considered in my medical decision making (see chart for details).    MDM Rules/Calculators/A&P                          45 year old male who presents for evaluation of back pain that goes into his leg x2 days.  No numbness/weakness.  No history of trauma, fall.  Initial arrival, he is afebrile nontoxic-appearing.  Vital signs are stable.  On exam, he has diffuse tenderness noted to the lower lumbar region.  He has no weakness noted.  Positive straight leg raise noted on the left.  History/physical exam consistent with sciatica.  No red flag symptoms.  No neuro deficit on exam.  History/exam not concerning for cauda equina, spinal abscess.  He reports that he has been told he has had a pinched nerve before.  He is never seen neurosurgery and has never had any back surgeries.  We will plan to treat this  symptomatically.  Patient given follow-up with neurosurgery if symptoms do not improve.  At this time, he has had no trauma, injury, fall.  Do not feel that x-rays are indicated in this instance. At this time, patient exhibits no emergent life-threatening condition that require further evaluation in ED. Patient had ample opportunity for questions and discussion. All patient's questions were answered with full understanding. Strict return precautions discussed. Patient expresses understanding and  agreement to plan.   Portions of this note were generated with Scientist, clinical (histocompatibility and immunogenetics). Dictation errors may occur despite best attempts at proofreading.  Final Clinical Impression(s) / ED Diagnoses Final diagnoses:  Acute left-sided low back pain with left-sided sciatica    Rx / DC Orders ED Discharge Orders         Ordered    methocarbamol (ROBAXIN) 500 MG tablet  2 times daily        02/25/20 1242    predniSONE (DELTASONE) 20 MG tablet  Daily        02/25/20 1242           Rosana Hoes 02/25/20 1420    Alvira Monday, MD 02/26/20 0825

## 2020-02-25 NOTE — ED Triage Notes (Signed)
Pt arrive pov, endorses left leg pain, hx of pinched nerve. Pt ambulatory to triage. Has been using lido patches and otc meds with no relief

## 2020-02-25 NOTE — Discharge Instructions (Signed)
You can take Tylenol or Ibuprofen as directed for pain. You can alternate Tylenol and Ibuprofen every 4 hours. If you take Tylenol at 1pm, then you can take Ibuprofen at 5pm. Then you can take Tylenol again at 9pm.   Do not take any ibuprofen until tonight.   Take Robaxin as prescribed. This medication will make you drowsy so do not drive or drink alcohol when taking it.  Take prednisone as directed.   Return to the Emergency Department immediately for any worsening back pain, neck pain, difficulty walking, numbness/weaknss of your arms or legs, urinary or bowel accidents, fever or any other worsening or concerning symptoms.

## 2020-02-28 ENCOUNTER — Encounter (HOSPITAL_BASED_OUTPATIENT_CLINIC_OR_DEPARTMENT_OTHER): Payer: Self-pay | Admitting: Emergency Medicine

## 2020-02-28 ENCOUNTER — Emergency Department (HOSPITAL_BASED_OUTPATIENT_CLINIC_OR_DEPARTMENT_OTHER)
Admission: EM | Admit: 2020-02-28 | Discharge: 2020-02-28 | Disposition: A | Discharge status: Home / Self Care | Payer: Self-pay | Attending: Emergency Medicine | Admitting: Emergency Medicine

## 2020-02-28 ENCOUNTER — Other Ambulatory Visit: Payer: Self-pay

## 2020-02-28 DIAGNOSIS — M5442 Lumbago with sciatica, left side: Secondary | ICD-10-CM | POA: Insufficient documentation

## 2020-02-28 NOTE — Discharge Instructions (Signed)
You have been seen here for left sided back pain. I recommend taking over-the-counter pain medications like ibuprofen and/or Tylenol every 6 hours.  Over the next 2 days please take ibuprofen and/or Tylenol every 6 hours weather or not you have pain this will help decrease inflammation and swelling in that area.  Please follow dosage and on the back of bottle.  I also recommend applying heat to the area and stretching out the muscles as this will help decrease stiffness and pain.  I have given you information on exercises please follow.  Please follow-up with neurosurgery for further evaluation of your back pain.  Come back to the emergency department if you develop chest pain, shortness of breath, severe abdominal pain, uncontrolled nausea, vomiting, diarrhea.

## 2020-02-28 NOTE — ED Triage Notes (Signed)
Pt is back today for recheck. Pain continues without relief with medications prescribed.  Pt neurosurgery, appointment next week.  Pt is here for medication upgrade and extension leave from job

## 2020-02-28 NOTE — ED Provider Notes (Signed)
[facility] EMERGENCY DEPARTMENT Provider Note   CSN: [id number] Arrival date & time: 02/28/20  0165     History Chief Complaint  Patient presents with  . Leg Pain    Angel Graves is a 44 y.o. male.  HPI   Patient with no significant medical history presents with chief complaint of left lower back pain.  Patient endorses that he has had pain for last few weeks but the pain has gotten worse.  He describes the pain as a sharp sensation when he feels in his left lower back and radiate down to his leg, he denies paresthesias or weakness in the lower extremities, urinary incontinency or retention, difficulty with bowel movements.  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  Patient was recently seen here in the emergency department 3 days ago, found that he had left sciatica and was started on prednisone and Robaxin.  Patient endorses that he is going to see neurosurgery next week and needs more time off from work.  Patient denies any alleviating factors.  Patient denies headaches, fevers, chills, shortness of breath, chest pain, abdominal pain, nausea, vomiting, diarrhea, pedal edema.  Past Medical History:  Diagnosis Date  . GERD (gastroesophageal reflux disease)     There are no problems to display for this patient.   Past Surgical History:  Procedure Laterality Date  . WRIST SURGERY         History reviewed. No pertinent family history.  Social History   Tobacco Use  . Smoking status: Never Smoker  . Smokeless tobacco: Never Used  Substance Use Topics  . Alcohol use: No  . Drug use: No    Home Medications Prior to Admission medications   Medication Sig Start Date End Date Taking? Authorizing Provider  benzonatate (TESSALON) 100 MG capsule Take 1 capsule (100 mg total) by mouth every 8 (eight) hours. 02/11/18   Cardama, Amadeo Garnet, MD   cephALEXin (KEFLEX) 500 MG capsule Take 1 capsule (500 mg total) by mouth 3 (three) times daily. 01/15/19   Raeford Razor, MD  lidocaine (LIDODERM) 5 % Place 1 patch onto the skin daily. Remove & Discard patch within 12 hours or as directed by MD 08/07/18   Henderly, Britni A, PA-C  lidocaine (XYLOCAINE) 2 % solution Use as directed 15 mLs in the mouth or throat as needed for mouth pain. 02/12/20   Caccavale, Sophia, PA-C  methocarbamol (ROBAXIN) 500 MG tablet Take 1 tablet (500 mg total) by mouth 2 (two) times daily. 02/25/20   Maxwell Caul, PA-C  naproxen (NAPROSYN) 500 MG tablet Take 1 tablet (500 mg total) by mouth 2 (two) times daily with a meal. 02/12/20   Caccavale, Sophia, PA-C  omeprazole (PRILOSEC) 20 MG capsule Take 1 capsule (20 mg total) by mouth daily. 11/14/18   Robinson, [place] N, PA-C  predniSONE (DELTASONE) 20 MG tablet Take 2 tablets (40 mg total) by mouth daily for 4 days. 02/25/20 02/29/20  Maxwell Caul, PA-C  sucralfate (CARAFATE) 1 GM/10ML suspension Take 10 mLs (1 g total) by mouth 4 (four) times daily -  with meals and at bedtime. 11/14/18   Robinson, [place] N, PA-C    Allergies    Patient has no known allergies.  Review of Systems   Review of Systems  Constitutional: Negative for chills and fever.  HENT: Negative for congestion.  Respiratory: Negative for shortness of breath.   Cardiovascular: Negative for chest pain.  Gastrointestinal: Negative for abdominal pain, diarrhea, nausea and vomiting.  Genitourinary: Negative for enuresis.  Musculoskeletal: Positive for back pain.  Skin: Negative for rash.  Neurological: Negative for dizziness and headaches.  Hematological: Does not bruise/bleed easily.    Physical Exam Updated Vital Signs BP (!) 139/101 (BP Location: Right Arm)   Pulse 76   Temp 98.2 F (36.8 C) (Oral)   Resp 16   Ht 6\' 4"  (1.93 m)   Wt 115.2 kg   SpO2 100%   BMI 30.92 kg/m   Physical Exam Vitals and nursing note reviewed.   Constitutional:      General: He is not in acute distress.    Appearance: He is not ill-appearing.  HENT:     Head: Normocephalic and atraumatic.     Nose: No congestion.  Eyes:     Conjunctiva/sclera: Conjunctivae normal.  Cardiovascular:     Rate and Rhythm: Normal rate and regular rhythm.     Pulses: Normal pulses.     Heart sounds: No murmur heard. No friction rub. No gallop.   Pulmonary:     Effort: No respiratory distress.     Breath sounds: No wheezing, rhonchi or rales.  Musculoskeletal:     Right lower leg: No edema.     Left lower leg: No edema.     Comments: Patient spine was palpated it was nontender to palpation, no step-off or deformities present.  Patient had full range of motion and strength equal strength bilaterally in his lower extremities, neurovascular fully intact.  Patient positive straight leg raise.  Skin:    General: Skin is warm and dry.  Neurological:     Mental Status: He is alert.  Psychiatric:        Mood and Affect: Mood normal.     ED Results / Procedures / Treatments   Labs (all labs ordered are listed, but only abnormal results are displayed) Labs Reviewed - No data to display  EKG None  Radiology No results found.  Procedures Procedures  Medications Ordered in ED Medications - No data to display  ED Course  I have reviewed the triage vital signs and the nursing notes.  Pertinent labs & imaging results that were available during my care of the patient were reviewed by me and considered in my medical decision making (see chart for details).    MDM Rules/Calculators/A&P                          Initial impression-patient presents with left lower back pain.  He is alert, does not appear in acute distress, vital signs reassuring.  Work-up-due to well-appearing patient, benign physical exam for the lab and imaging not warranted at this time.  Rule out- I have low suspicion for spinal fracture or spinal cord abnormality as  patient denies urinary incontinency, retention, difficulty with bowel movements, denies saddle paresthesias.  Spine was palpated there is no step-off, crepitus or gross deformities felt, patient had 5/5 strength, full range of motion, neurovascular fully intact in the lower extremities.  Will defer imaging at this time as there is no recent trauma to the area.. Low suspicion for septic arthritis as patient denies IV drug use, skin exam was performed no erythematous, edema or warm joints noted.   Plan- after reviewing patient's chart he has a bulging disc in L4-L5, and his history is consistent with  sciatica.  will encourage him to finished his medications as prescribed and have him follow-up with neurosurgery for further evaluation.  Vital signs have remained stable, no indication for hospital admission.   Patient given at home care as well strict return precautions.  Patient verbalized that they understood agreed to said plan.   Final Clinical Impression(s) / ED Diagnoses Final diagnoses:  Acute left-sided low back pain with left-sided sciatica    Rx / DC Orders ED Discharge Orders    None       Carroll Sage, PA-C 02/28/20 1115    Terrilee Files, MD 02/28/20 1730

## 2020-02-28 NOTE — ED Notes (Signed)
ED Provider at bedside. 

## 2020-04-02 IMAGING — CT CT CHEST W/ CM
2 of 3 series · 15 of 36 positions shown, 18 images · IV contrast (omnipaque)
Comparison: None.

CLINICAL DATA: Chest pain, chest wall abscess cellulitis swollen
left axilla

EXAM:
CT CHEST WITH CONTRAST
TECHNIQUE: Multidetector CT imaging of the chest was performed during
intravenous contrast administration.
CONTRAST:  100mL OMNIPAQUE IOHEXOL 300 MG/ML  SOLN

[Series 4: axial st · axial · 0.85mm/px · z∈[-197,+117]mm · 12 of 185 slices shown, 15 images]
[im 14/185  mediastinal]
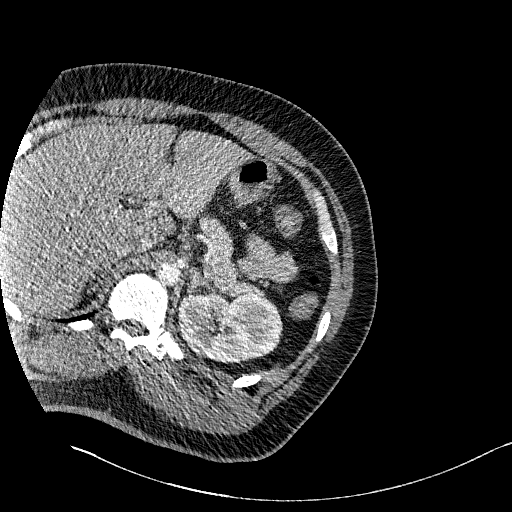
[im 14/185  lung]
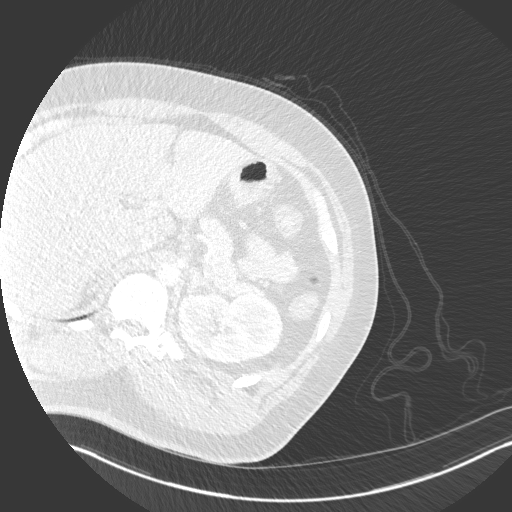
[im 28/185  lung]
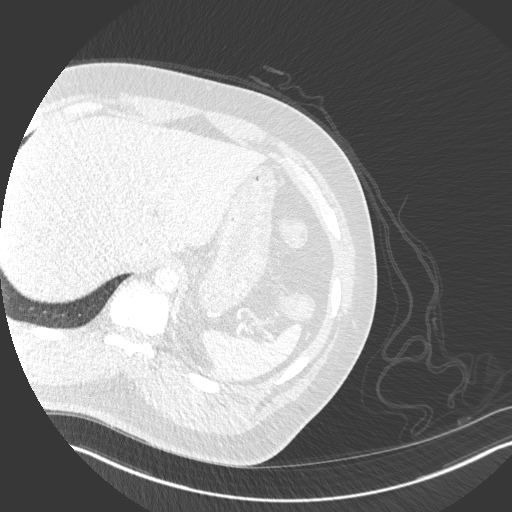
[im 41/185  lung]
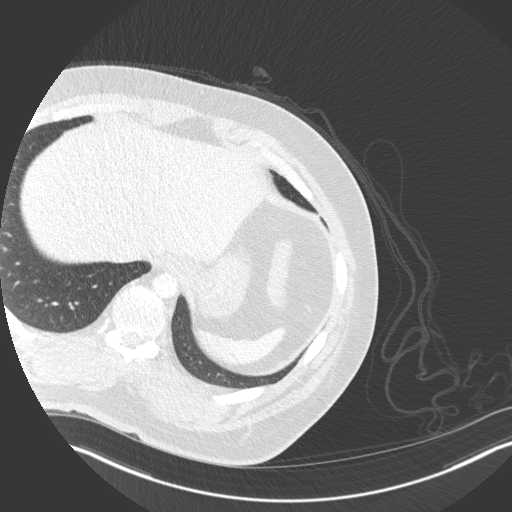
[im 55/185  lung]
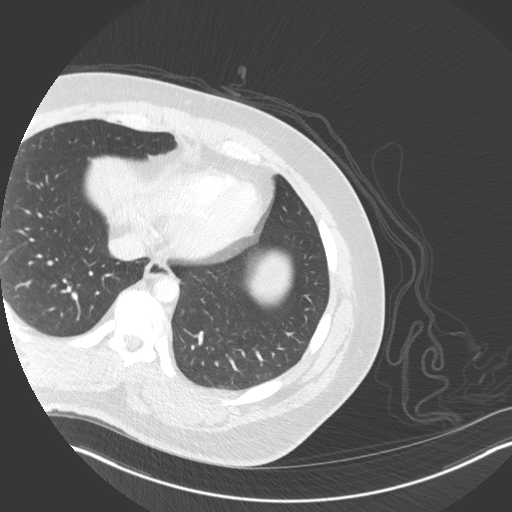
[im 69/185  mediastinal]
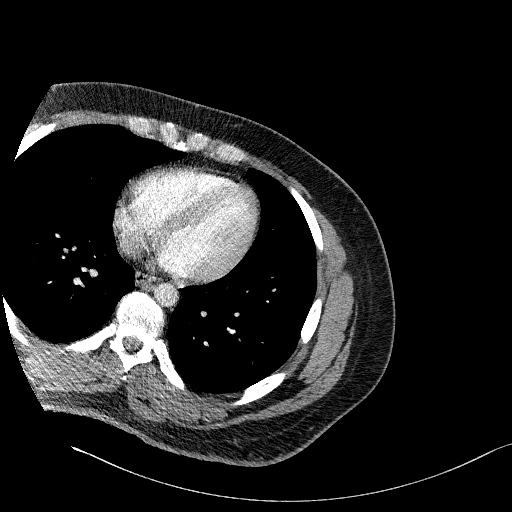
[im 69/185  lung]
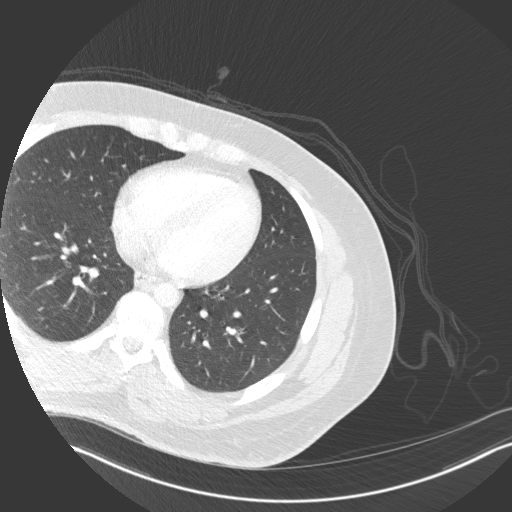
[im 82/185  lung]
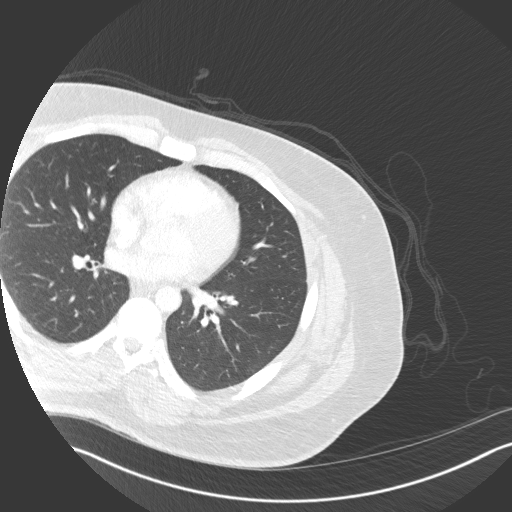
[im 103/185  lung]
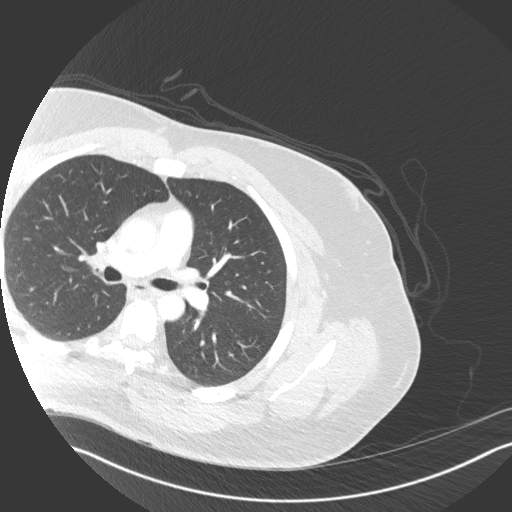
[im 116/185  lung]
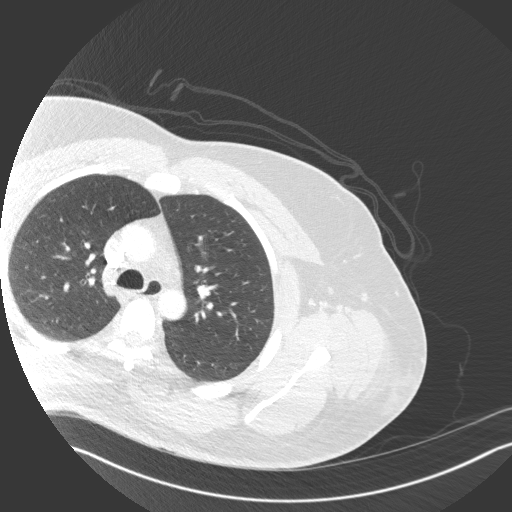
[im 130/185  mediastinal]
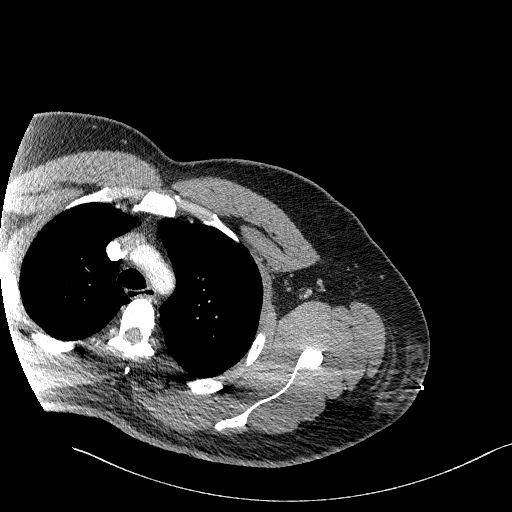
[im 130/185  lung]
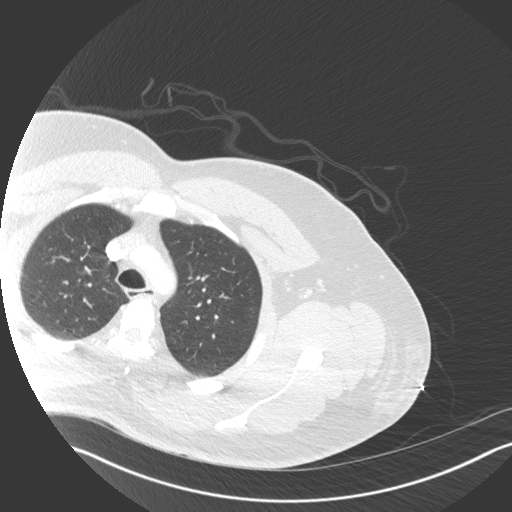
[im 144/185  lung]
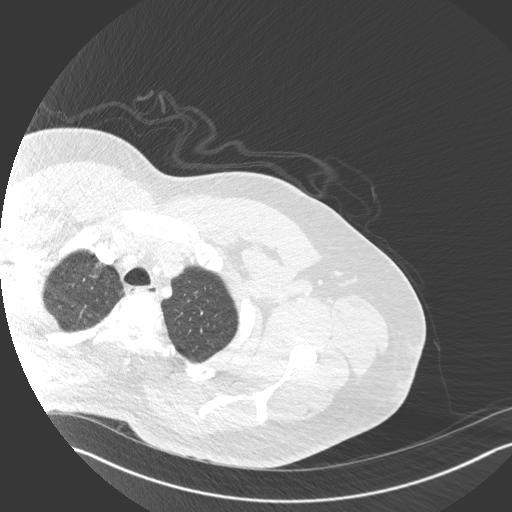
[im 157/185  lung]
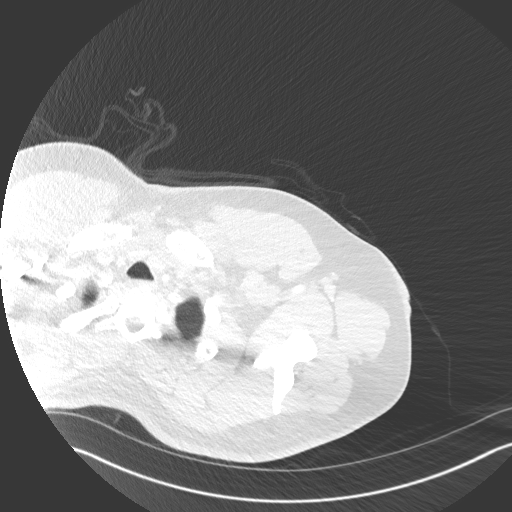
[im 171/185  lung]
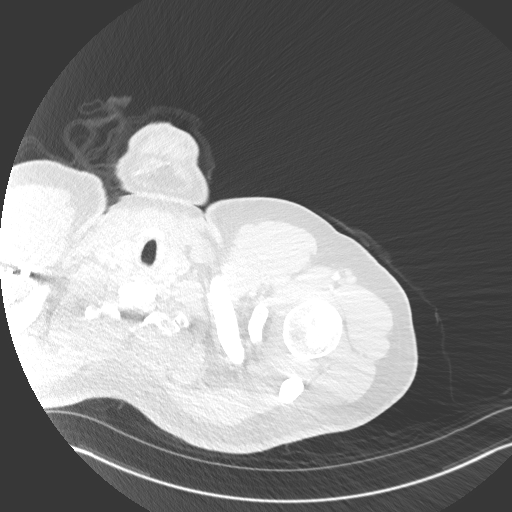

[Series 7: coronal · coronal · 0.77mm/px · 3 of 147 slices shown]
[im 30/147  lung]
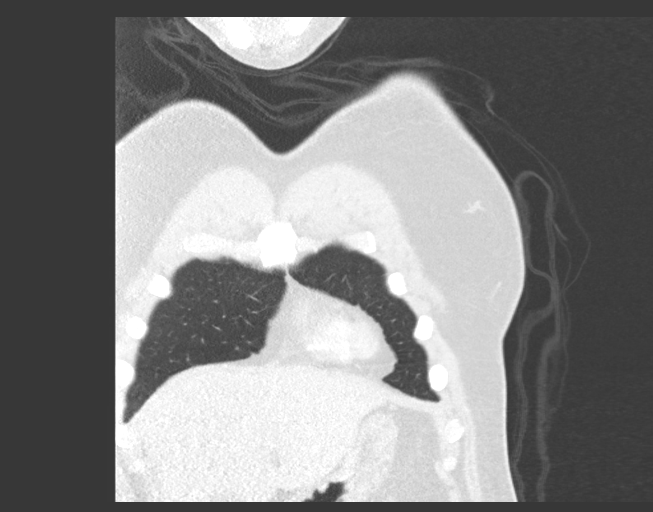
[im 59/147  lung]
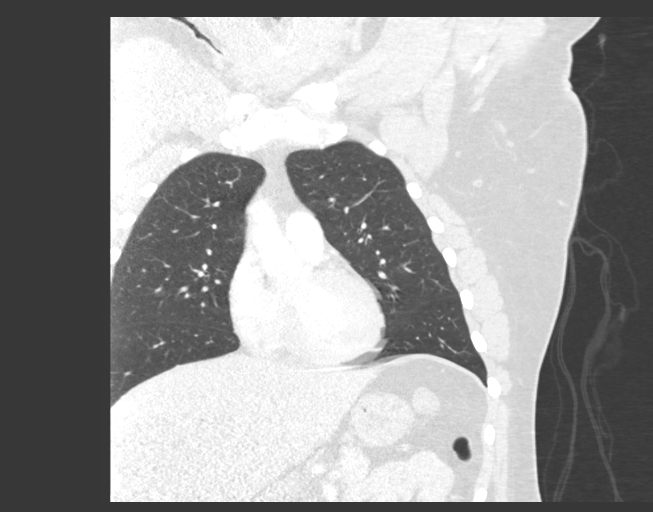
[im 88/147  lung]
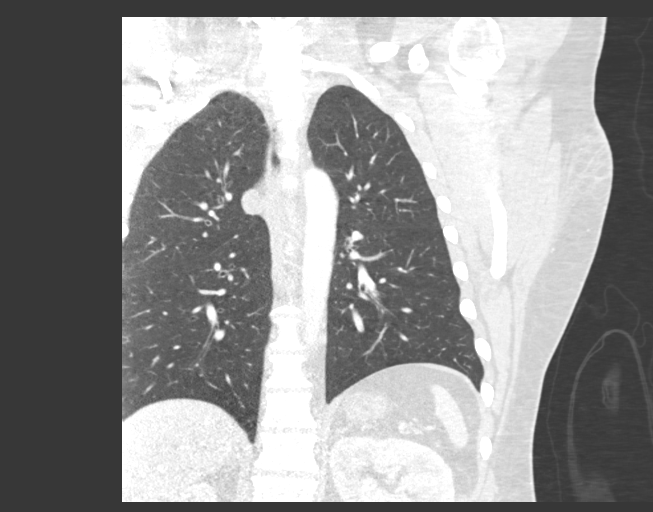

[15 of 36 positions shown; findings below may reference images not displayed]

FINDINGS: Note that the right chest was incompletely included in the field of
view

Cardiovascular: Nonaneurysmal aorta. Normal heart size. Negative for
pericardial effusion

Mediastinum/Nodes: No enlarged mediastinal, hilar, or axillary lymph
nodes. Thyroid gland, trachea, and esophagus demonstrate no
significant findings.

Lungs/Pleura: Lungs are clear. No pleural effusion or pneumothorax.

Upper Abdomen: No acute abnormality.

Musculoskeletal: No acute osseous abnormality. Small amount of left
gynecomastia. Metallic BB over the left upper posterolateral chest
wall in the region of concern. There is infiltration of the
subcutaneous fat consistent with edema. No rim enhancing fluid
collection. Negative for radiopaque foreign body or soft tissue
emphysema.
IMPRESSION: 1. Incomplete inclusion of the right thorax
2. Edema within the subcutaneous fat of the left upper
posterolateral chest wall in the region demarcated by a BB, felt
consistent with cellulitis. Negative for rim enhancing fluid
collection to suggest abscess.
3. Mild gynecomastia

## 2023-10-12 ENCOUNTER — Emergency Department (HOSPITAL_BASED_OUTPATIENT_CLINIC_OR_DEPARTMENT_OTHER)

## 2023-10-12 ENCOUNTER — Emergency Department (HOSPITAL_BASED_OUTPATIENT_CLINIC_OR_DEPARTMENT_OTHER)
Admission: EM | Admit: 2023-10-12 | Discharge: 2023-10-12 | Disposition: A | Discharge status: Home / Self Care | Attending: Emergency Medicine | Admitting: Emergency Medicine

## 2023-10-12 ENCOUNTER — Encounter (HOSPITAL_BASED_OUTPATIENT_CLINIC_OR_DEPARTMENT_OTHER): Payer: Self-pay | Admitting: Emergency Medicine

## 2023-10-12 ENCOUNTER — Other Ambulatory Visit: Payer: Self-pay

## 2023-10-12 DIAGNOSIS — M7711 Lateral epicondylitis, right elbow: Secondary | ICD-10-CM | POA: Diagnosis not present

## 2023-10-12 DIAGNOSIS — M25521 Pain in right elbow: Secondary | ICD-10-CM | POA: Diagnosis present

## 2023-10-12 DIAGNOSIS — M7701 Medial epicondylitis, right elbow: Secondary | ICD-10-CM | POA: Diagnosis not present

## 2023-10-12 DIAGNOSIS — S59901A Unspecified injury of right elbow, initial encounter: Secondary | ICD-10-CM | POA: Diagnosis not present

## 2023-10-12 MED ORDER — CELECOXIB 200 MG PO CAPS: 200.0000 mg | ORAL_CAPSULE | Freq: Two times a day (BID) | ORAL | 1 refills | Status: AC

## 2023-10-12 NOTE — ED Provider Notes (Signed)
  EMERGENCY DEPARTMENT AT [facility] Provider Note   CSN: [id number] Arrival date & time: 10/12/23  1023     Patient presents with: Elbow Pain   Angel Graves is a 48 y.o. male who presents due to chronic right elbow pain, states that this began months prior, increases when he pronates/supinates the right forearm as well as with extension of the right elbow, has point tenderness to the lateral right elbow.  No recent falls or injuries onto the elbow.   HPI     Prior to Admission medications   Medication Sig Start Date End Date Taking? Authorizing Provider  celecoxib (CELEBREX) 200 MG capsule Take 1 capsule (200 mg total) by mouth 2 (two) times daily. 10/12/23  Yes Myriam Dorn BROCKS, PA  benzonatate  (TESSALON ) 100 MG capsule Take 1 capsule (100 mg total) by mouth every 8 (eight) hours. 02/11/18   Trine Raynell Moder, MD  cephALEXin  (KEFLEX ) 500 MG capsule Take 1 capsule (500 mg total) by mouth 3 (three) times daily. 01/15/19   Loetta Senior, MD  lidocaine  (LIDODERM ) 5 % Place 1 patch onto the skin daily. Remove & Discard patch within 12 hours or as directed by MD 08/07/18   Henderly, Britni A, PA-C  lidocaine  (XYLOCAINE ) 2 % solution Use as directed 15 mLs in the mouth or throat as needed for mouth pain. 02/12/20   Caccavale, Sophia, PA-C  methocarbamol  (ROBAXIN ) 500 MG tablet Take 1 tablet (500 mg total) by mouth 2 (two) times daily. 02/25/20   Layden, Lindsey A, PA-C  naproxen  (NAPROSYN ) 500 MG tablet Take 1 tablet (500 mg total) by mouth 2 (two) times daily with a meal. 02/12/20   Caccavale, Sophia, PA-C  omeprazole  (PRILOSEC) 20 MG capsule Take 1 capsule (20 mg total) by mouth daily. 11/14/18   Robinson, Jordan N, PA-C  sucralfate  (CARAFATE ) 1 GM/10ML suspension Take 10 mLs (1 g total) by mouth 4 (four) times daily -  with meals and at bedtime. 11/14/18   Robinson, [place] N, PA-C    Allergies: Patient has no known allergies.    Review of Systems  Musculoskeletal:   Positive for arthralgias.  All other systems reviewed and are negative.   Updated Vital Signs BP 132/89 (BP Location: Left Arm)   Pulse 73   Temp 98.3 F (36.8 C) (Oral)   Resp 16   Wt 120.2 kg   SpO2 98%   BMI 32.26 kg/m   Physical Exam Vitals and nursing note reviewed.  Constitutional:      General: He is not in acute distress.    Appearance: He is well-developed.  HENT:     Head: Normocephalic and atraumatic.  Eyes:     Conjunctiva/sclera: Conjunctivae normal.  Cardiovascular:     Rate and Rhythm: Normal rate and regular rhythm.     Heart sounds: No murmur heard. Pulmonary:     Effort: Pulmonary effort is normal. No respiratory distress.     Breath sounds: Normal breath sounds.  Abdominal:     Palpations: Abdomen is soft.     Tenderness: There is no abdominal tenderness.  Musculoskeletal:        General: No swelling.     Right elbow: Tenderness present in lateral epicondyle.     Left elbow: Normal.     Cervical back: Neck supple.     Comments: Exquisite tenderness to the lateral epicondyle of the right elbow.  No notable effusion or edema to the same.  Skin:    General: Skin  is warm and dry.     Capillary Refill: Capillary refill takes less than 2 seconds.  Neurological:     Mental Status: He is alert and oriented to person, place, and time.     GCS: GCS eye subscore is 4. GCS verbal subscore is 5. GCS motor subscore is 6.     Comments: No distal numbness or paresthesia to bilateral upper and lower extremities.  Psychiatric:        Mood and Affect: Mood normal.     (all labs ordered are listed, but only abnormal results are displayed) Labs Reviewed - No data to display  EKG: None  Radiology: DG Elbow Complete Right Result Date: 10/12/2023 CLINICAL DATA:  Right elbow pain 3 months.  History of old injury. EXAM: RIGHT ELBOW - COMPLETE 3+ VIEW COMPARISON:  None Available. FINDINGS: No evidence of acute fracture or dislocation. 2 mm chronic well corticated  fragment adjacent the coronoid process of the proximal ulna. No significant degenerative changes. Soft tissues are normal. IMPRESSION: No acute findings. Electronically Signed   By: Toribio Agreste M.D.   On: 10/12/2023 11:28     Procedures   Medications Ordered in the ED - No data to display                                  Medical Decision Making Amount and/or Complexity of Data Reviewed Radiology: ordered.  Risk Prescription drug management.   Given his history, x-ray imaging was obtained of the left elbow which did not show any acute fracture.  There is some evidence of a 2 mm chronic well-corticated fragment of the coronoid process of the proximal ulna however pain is isolated over the lateral epicondyle.  Given the exquisite tenderness to the lateral epicondyle and negative x-ray findings finding that this is likely a lateral epicondylitis.  Will manage this with an outpatient course of celecoxib as well as placing his right elbow in a sling to allow for further recovery, referred to orthopedics for further workup.  This was explained to the patient, he understands and agrees has no further concerns at this time.  As he has negative x-ray findings, physical exam is reassuring as he has no distal numbness or paresthesia, intact motor function, and pain is manageable with positioning, plan he stable for discharge at this time.       Final diagnoses:  Lateral epicondylitis of right elbow    ED Discharge Orders          Ordered    celecoxib (CELEBREX) 200 MG capsule  2 times daily        10/12/23 1253               Myriam Dorn BROCKS, [place] 10/12/23 1259    Geraldene Hamilton, MD 10/13/23 2227

## 2023-10-12 NOTE — ED Triage Notes (Signed)
 Old injury to right elbow , pain when he he holds or squeeze objects , no swelling .
